# Patient Record
Sex: Male | Born: 1985 | Race: Black or African American | Hispanic: No | Marital: Single | Smoking: Current every day smoker
Health system: Southern US, Community
[De-identification: ages and names within clinical notes are randomized; demographics above are authoritative.]

## PROBLEM LIST (undated history)

## (undated) DIAGNOSIS — E119 Type 2 diabetes mellitus without complications: Secondary | ICD-10-CM

---

## 2008-11-23 ENCOUNTER — Emergency Department: Payer: Self-pay | Admitting: Emergency Medicine

## 2008-12-14 ENCOUNTER — Emergency Department: Payer: Self-pay | Admitting: Emergency Medicine

## 2011-03-28 IMAGING — CR DG CHEST 2V
1 series · 2 of 2 positions shown · non-contrast
Comparison: none

REASON FOR EXAM: cough, fever, eval for infiltrate
COMMENTS:

PROCEDURE:     DXR - DXR CHEST PA (OR AP) AND LATERAL  - November 23, 2008 [DATE]
RESULT:     The lung fields are clear. The heart, mediastinal and osseous
structures show no significant abnormalities.

[Series 1: view not recorded · 0.17mm/px · 2 of 2 slices shown]
[im 1/2]
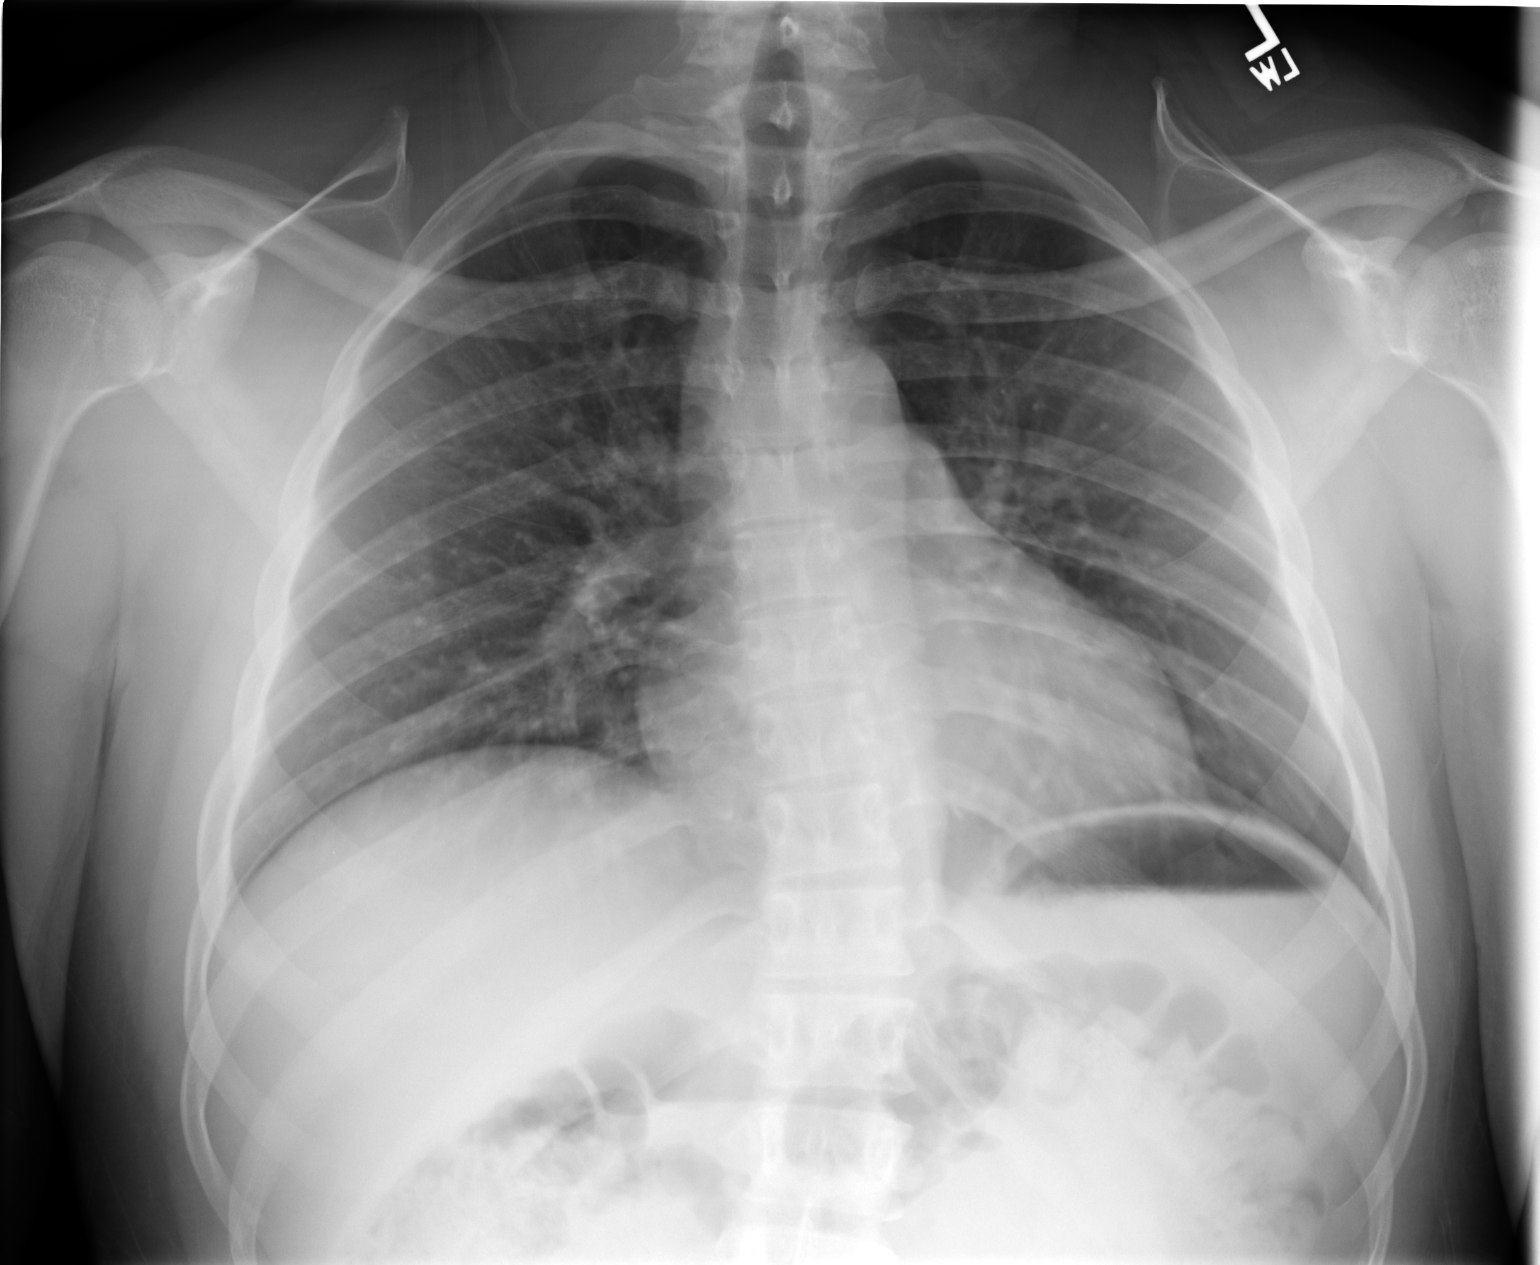
[im 2/2]
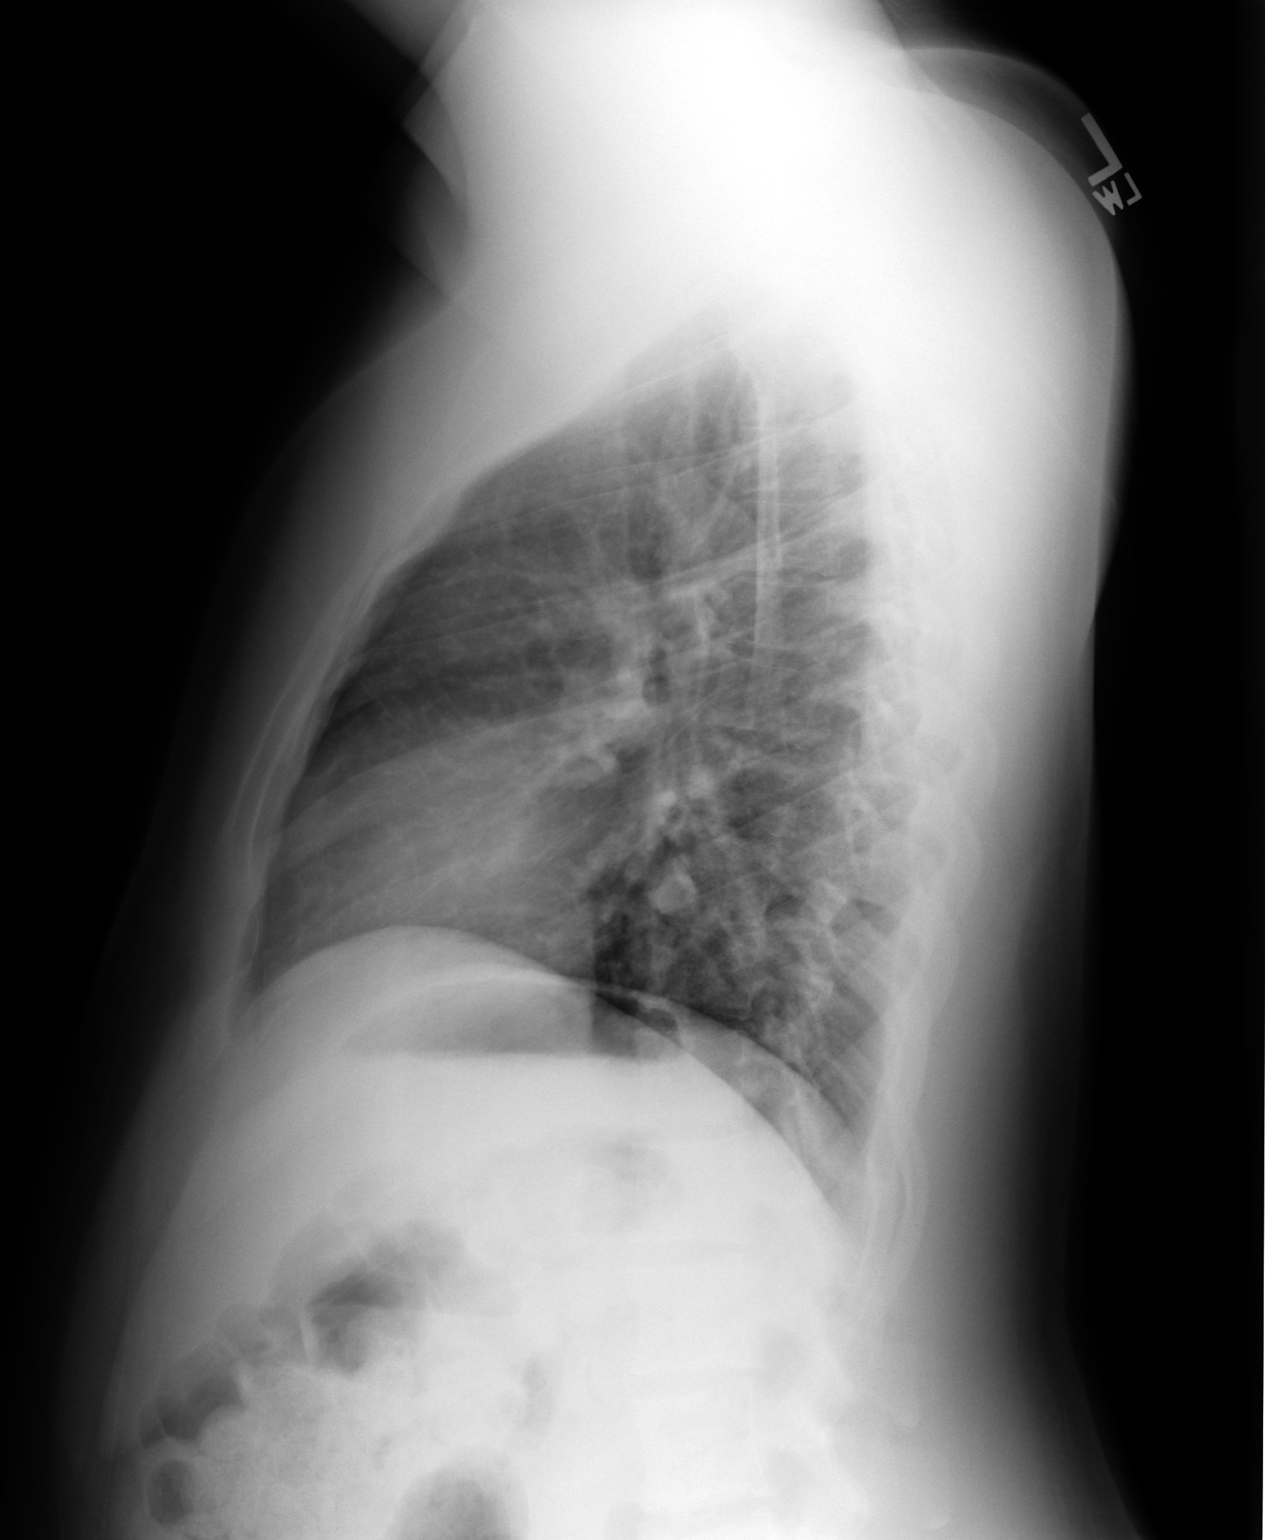

[2 of 2 positions shown; findings below may reference images not displayed]

IMPRESSION: 1.     No significant abnormalities are noted.

## 2015-12-17 ENCOUNTER — Emergency Department: Payer: Self-pay

## 2015-12-17 ENCOUNTER — Encounter: Payer: Self-pay | Admitting: Emergency Medicine

## 2015-12-17 ENCOUNTER — Emergency Department
Admission: EM | Admit: 2015-12-17 | Discharge: 2015-12-17 | Disposition: A | Discharge status: Home / Self Care | Payer: Self-pay | Attending: Emergency Medicine | Admitting: Emergency Medicine

## 2015-12-17 DIAGNOSIS — Y929 Unspecified place or not applicable: Secondary | ICD-10-CM | POA: Insufficient documentation

## 2015-12-17 DIAGNOSIS — H1031 Unspecified acute conjunctivitis, right eye: Secondary | ICD-10-CM | POA: Insufficient documentation

## 2015-12-17 DIAGNOSIS — S41132A Puncture wound without foreign body of left upper arm, initial encounter: Secondary | ICD-10-CM | POA: Insufficient documentation

## 2015-12-17 DIAGNOSIS — F17210 Nicotine dependence, cigarettes, uncomplicated: Secondary | ICD-10-CM | POA: Insufficient documentation

## 2015-12-17 DIAGNOSIS — Y999 Unspecified external cause status: Secondary | ICD-10-CM | POA: Insufficient documentation

## 2015-12-17 DIAGNOSIS — Y9389 Activity, other specified: Secondary | ICD-10-CM | POA: Insufficient documentation

## 2015-12-17 DIAGNOSIS — Z23 Encounter for immunization: Secondary | ICD-10-CM | POA: Insufficient documentation

## 2015-12-17 MED ORDER — TETANUS-DIPHTH-ACELL PERTUSSIS 5-2.5-18.5 LF-MCG/0.5 IM SUSP
0.5000 mL | Freq: Once | INTRAMUSCULAR | Status: AC
  Administered 2015-12-17: 0.5 mL via INTRAMUSCULAR
  Filled 2015-12-17: qty 0.5

## 2015-12-17 MED ORDER — CEPHALEXIN 500 MG PO CAPS: 1000.0000 mg | ORAL_CAPSULE | Freq: Two times a day (BID) | ORAL | 0 refills | Status: AC

## 2015-12-17 MED ORDER — ERYTHROMYCIN 5 MG/GM OP OINT: TOPICAL_OINTMENT | Freq: Three times a day (TID) | OPHTHALMIC | 0 refills | Status: AC
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
## 2015-12-17 NOTE — ED Notes (Signed)
Pt also reports redness to the right eye that started a couple of days ago. Pt states it has been itching but denies any purulent drainage or crusting of the lid. Conjunctiva is red on the right, clear on the left.
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
## 2016-01-24 ENCOUNTER — Emergency Department
Admission: EM | Admit: 2016-01-24 | Discharge: 2016-01-24 | Disposition: A | Discharge status: Home / Self Care | Payer: Self-pay | Attending: Emergency Medicine | Admitting: Emergency Medicine

## 2016-01-24 ENCOUNTER — Encounter: Payer: Self-pay | Admitting: Emergency Medicine

## 2016-01-24 DIAGNOSIS — M62830 Muscle spasm of back: Secondary | ICD-10-CM | POA: Insufficient documentation

## 2016-01-24 DIAGNOSIS — M62838 Other muscle spasm: Secondary | ICD-10-CM

## 2016-01-24 DIAGNOSIS — F17210 Nicotine dependence, cigarettes, uncomplicated: Secondary | ICD-10-CM | POA: Insufficient documentation

## 2016-01-24 MED ORDER — NAPROXEN 125 MG/5ML PO SUSP: 500.0000 mg | Freq: Two times a day (BID) | ORAL | 0 refills | Status: AC

## 2016-01-24 NOTE — ED Triage Notes (Signed)
He developed left sided lower back pain fopr the past couple of days  States pain radiates up into mid back   Ambulates well denies any urinary sx
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
## 2016-05-22 ENCOUNTER — Encounter: Payer: Self-pay | Admitting: Emergency Medicine

## 2016-05-22 ENCOUNTER — Emergency Department
Admission: EM | Admit: 2016-05-22 | Discharge: 2016-05-22 | Disposition: A | Discharge status: Home / Self Care | Payer: Self-pay | Attending: Emergency Medicine | Admitting: Emergency Medicine

## 2016-05-22 ENCOUNTER — Ambulatory Visit
Admission: EM | Admit: 2016-05-22 | Discharge: 2016-05-22 | Disposition: A | Discharge status: Other Acute Inpatient Hospital | Payer: Self-pay | Attending: Family Medicine | Admitting: Family Medicine

## 2016-05-22 DIAGNOSIS — R739 Hyperglycemia, unspecified: Secondary | ICD-10-CM

## 2016-05-22 DIAGNOSIS — F17210 Nicotine dependence, cigarettes, uncomplicated: Secondary | ICD-10-CM | POA: Insufficient documentation

## 2016-05-22 DIAGNOSIS — E119 Type 2 diabetes mellitus without complications: Secondary | ICD-10-CM | POA: Insufficient documentation

## 2016-05-22 DIAGNOSIS — R350 Frequency of micturition: Secondary | ICD-10-CM

## 2016-05-22 LAB — CBC
HEMATOCRIT: 45.5 % (ref 40.0–52.0)
Hemoglobin: 16.1 g/dL (ref 13.0–18.0)
MCH: 29.3 pg (ref 26.0–34.0)
MCHC: 35.5 g/dL (ref 32.0–36.0)
MCV: 82.6 fL (ref 80.0–100.0)
PLATELETS: 210 10*3/uL (ref 150–440)
RBC: 5.51 MIL/uL (ref 4.40–5.90)
RDW: 13 % (ref 11.5–14.5)
WBC: 7.4 10*3/uL (ref 3.8–10.6)

## 2016-05-22 LAB — URINALYSIS, COMPLETE (UACMP) WITH MICROSCOPIC
BILIRUBIN URINE: NEGATIVE
Bacteria, UA: NONE SEEN
Bacteria, UA: NONE SEEN
Bilirubin Urine: NEGATIVE
GLUCOSE, UA: 500 mg/dL — AB
Glucose, UA: >=500 mg/dL — AB
Hgb urine dipstick: NEGATIVE
Ketones, ur: 15 mg/dL — AB
Ketones, ur: 20 mg/dL — AB
Leukocytes, UA: NEGATIVE
Leukocytes, UA: NEGATIVE
Nitrite: NEGATIVE
Nitrite: NEGATIVE
PROTEIN: NEGATIVE mg/dL
Protein, ur: NEGATIVE mg/dL
RBC / HPF: NONE SEEN RBC/hpf (ref 0–5)
Specific Gravity, Urine: <1.005 — ABNORMAL LOW (ref 1.005–1.030)
Specific Gravity, Urine: 1.035 — ABNORMAL HIGH (ref 1.005–1.030)
pH: 5.5 (ref 5.0–8.0)
pH: 6 (ref 5.0–8.0)

## 2016-05-22 LAB — BASIC METABOLIC PANEL
Anion gap: 13 (ref 5–15)
BUN: 12 mg/dL (ref 6–20)
CO2: 22 mmol/L (ref 22–32)
Calcium: 9.6 mg/dL (ref 8.9–10.3)
Chloride: 94 mmol/L — ABNORMAL LOW (ref 101–111)
Creatinine, Ser: 1.02 mg/dL (ref 0.61–1.24)
GFR calc Af Amer: >60 mL/min (ref >60)
GFR calc non Af Amer: >60 mL/min (ref >60)
Glucose, Bld: 552 mg/dL (ref 65–99)
Potassium: 3.7 mmol/L (ref 3.5–5.1)
Sodium: 129 mmol/L — ABNORMAL LOW (ref 135–145)

## 2016-05-22 LAB — GLUCOSE, CAPILLARY
Glucose-Capillary: 550 mg/dL (ref 65–99)
Glucose-Capillary: 497 mg/dL — ABNORMAL HIGH (ref 65–99)
Glucose-Capillary: 377 mg/dL — ABNORMAL HIGH (ref 65–99)

## 2016-05-22 MED ORDER — LIVING WELL WITH DIABETES BOOK
Freq: Once | Status: AC
  Administered 2016-05-22: 16:00:00
  Filled 2016-05-22: qty 1

## 2016-05-22 MED ORDER — METFORMIN HCL 500 MG PO TABS
500.0000 mg | ORAL_TABLET | Freq: Once | ORAL | Status: AC
  Administered 2016-05-22: 500 mg via ORAL
  Filled 2016-05-22: qty 1

## 2016-05-22 MED ORDER — METFORMIN HCL 500 MG PO TABS: 500.0000 mg | ORAL_TABLET | Freq: Two times a day (BID) | ORAL | 11 refills | Status: DC

## 2016-05-22 MED ORDER — SODIUM CHLORIDE 0.9 % IV BOLUS (SEPSIS)
1000.0000 mL | Freq: Once | INTRAVENOUS | Status: AC
  Administered 2016-05-22: 1000 mL via INTRAVENOUS

## 2016-05-22 MED ORDER — SODIUM CHLORIDE 0.9 % IV SOLN
Freq: Once | INTRAVENOUS | Status: AC
  Administered 2016-05-22: 17:00:00 via INTRAVENOUS

## 2016-05-22 MED ORDER — INSULIN ASPART 100 UNIT/ML ~~LOC~~ SOLN
2.0000 [IU] | Freq: Once | SUBCUTANEOUS | Status: AC
  Administered 2016-05-22: 2 [IU] via INTRAVENOUS

## 2016-05-22 MED ORDER — INSULIN ASPART 100 UNIT/ML ~~LOC~~ SOLN
SUBCUTANEOUS | Status: AC
  Administered 2016-05-22: 2 [IU] via INTRAVENOUS
  Filled 2016-05-22: qty 2

## 2016-05-22 NOTE — ED Triage Notes (Signed)
Patient c/o increase in urinary urgency and frequency.  Patient reports being more thirsty.

## 2016-05-22 NOTE — ED Notes (Signed)
CBG 377. Lenise ArenaKate,RN notified and [facility].

## 2016-05-22 NOTE — ED Notes (Signed)
Pt states no hx of DM, states grandparent and cousin but not parents. Pt states CBG elevated yest. Pt states he has been drinking a lot more and urinating a lot more. Pt appears anxious.

## 2016-05-22 NOTE — Discharge Instructions (Signed)
Start the metformin 1 pill twice a day. Check your fingersticks at least in the morning before breakfast and before bed. Keep a list of all the blood glucose readings. Get a follow-up appointment with a doctor. You can try [facility]. They have a charity care program which should help with the cost. You can also try [facility]. You can try the [facility], or the [facility], the [facility] or the [facility]. These are all either reduced cost or free but they're harder to get into some times. [facility] also sometimes conceive for reduced cost. Please return here if your sugar goes above 350. After you have taken the metformin 500 mg twice a day for about a week and if your blood glucose is 250 or higher you can increase the metformin  to thousand milligrams in the morning and 500 mg at night. If after 2 weeks her blood sugars are still over 250 and you have not been able to get into a doctor you can increase the metformin to 1000 mg twice a day. Return to the emergency room immediately if you began to feel sick or ill at all.

## 2016-05-22 NOTE — ED Provider Notes (Signed)
[facility]    CSN: [id number] Arrival date & time: 05/22/16  1220     History   Chief Complaint Chief Complaint  Patient presents with  . Urinary Frequency    HPI Barry Jennings is a 32 y.o. male.   Patient is a 31 year old [place] male who's been having frequency for over 2 weeks now. He states is progressively getting worse the point where he is unable sleep at night he's going getting up going to the bathroom multiple times during the day feels weak and tired denies any weight loss. Denies any burning with urination just going to the bathroom more often. No history of diabetes in the family except for his grandmother. He states that he has previous surgery so previous medical problems no known drug allergies but he does smoke.   The history is provided by the patient and a significant other. No language interpreter was used.  Urinary Frequency  This is a new problem. The current episode started more than 1 week ago. The problem occurs constantly. The problem has been gradually worsening. Pertinent negatives include no chest pain, no abdominal pain, no headaches and no shortness of breath. Nothing aggravates the symptoms. Nothing relieves the symptoms. He has tried nothing for the symptoms. The treatment provided no relief.    History reviewed. No pertinent past medical history.  There are no active problems to display for this patient.   History reviewed. No pertinent surgical history.     Home Medications    Prior to Admission medications   Not on File    Family History History reviewed. No pertinent family history.  Social History Social History  Substance Use Topics  . Smoking status: Current Every Day Smoker    Packs/day: 0.50    Types: Cigarettes  . Smokeless tobacco: Never Used  . Alcohol use Yes     Allergies   Patient has no known allergies.   Review of Systems Review of Systems  Respiratory: Negative for shortness of  breath.   Cardiovascular: Negative for chest pain.  Gastrointestinal: Negative for abdominal pain.  Genitourinary: Positive for frequency.  Neurological: Positive for weakness. Negative for headaches.  Psychiatric/Behavioral: Positive for sleep disturbance.  All other systems reviewed and are negative.    Physical Exam Triage Vital Signs ED Triage Vitals  Enc Vitals Group     BP 05/22/16 1256 119/73     Pulse Rate 05/22/16 1256 (!) 102     Resp 05/22/16 1256 16     Temp 05/22/16 1256 98.2 F (36.8 C)     Temp Source 05/22/16 1256 Oral     SpO2 05/22/16 1256 96 %     Weight 05/22/16 1255 205 lb (93 kg)     Height 05/22/16 1255 5\' 6"  (1.676 m)     Head Circumference --      Peak Flow --      Pain Score 05/22/16 1255 0     Pain Loc --      Pain Edu? --      Excl. in GC? --    No data found.   Updated Vital Signs BP 119/73 (BP Location: Left Arm)   Pulse (!) 102   Temp 98.2 F (36.8 C) (Oral)   Resp 16   Ht 5\' 6"  (1.676 m)   Wt 205 lb (93 kg)   SpO2 96%   BMI 33.09 kg/m   Visual Acuity Right Eye Distance:   Left Eye Distance:  Bilateral Distance:    Right Eye Near:   Left Eye Near:    Bilateral Near:     Physical Exam  Constitutional: He is oriented to person, place, and time. He appears well-developed and well-nourished.  HENT:  Head: Normocephalic and atraumatic.  Right Ear: External ear normal.  Left Ear: External ear normal.  Mouth/Throat: Oropharynx is clear and moist.  Eyes: Pupils are equal, round, and reactive to light.  Neck: Normal range of motion. Neck supple.  Cardiovascular: Normal rate and regular rhythm.   Pulmonary/Chest: Effort normal.  Musculoskeletal: Normal range of motion. He exhibits no edema.  Neurological: He is alert and oriented to person, place, and time.  Skin: Skin is warm.  Psychiatric: He has a normal mood and affect.  Vitals reviewed.    UC Treatments / Results  Labs (all labs ordered are listed, but only abnormal  results are displayed) Labs Reviewed  URINALYSIS, COMPLETE (UACMP) WITH MICROSCOPIC - Abnormal; Notable for the following:       Result Value   Color, Urine STRAW (*)    Specific Gravity, Urine <1.005 (*)    Glucose, UA 500 (*)    Hgb urine dipstick TRACE (*)    Ketones, ur 15 (*)    Squamous Epithelial / LPF 0-5 (*)    All other components within normal limits  GLUCOSE, CAPILLARY - Abnormal; Notable for the following:    Glucose-Capillary 550 (*)    All other components within normal limits  URINE CULTURE  CBG MONITORING, ED    EKG  EKG Interpretation None       Radiology No results found.  Procedures Procedures (including critical care time)  Medications Ordered in UC Medications - No data to display  Results for orders placed or performed during the hospital encounter of 05/22/16  Urinalysis, Complete w Microscopic  Result Value Ref Range   Color, Urine STRAW (A) YELLOW   APPearance CLEAR CLEAR   Specific Gravity, Urine <1.005 (L) 1.005 - 1.030   pH 5.5 5.0 - 8.0   Glucose, UA 500 (A) NEGATIVE mg/dL   Hgb urine dipstick TRACE (A) NEGATIVE   Bilirubin Urine NEGATIVE NEGATIVE   Ketones, ur 15 (A) NEGATIVE mg/dL   Protein, ur NEGATIVE NEGATIVE mg/dL   Nitrite NEGATIVE NEGATIVE   Leukocytes, UA NEGATIVE NEGATIVE   Squamous Epithelial / LPF 0-5 (A) NONE SEEN   WBC, UA 6-30 0 - 5 WBC/hpf   RBC / HPF NONE SEEN 0 - 5 RBC/hpf   Bacteria, UA NONE SEEN NONE SEEN  Glucose, capillary  Result Value Ref Range   Glucose-Capillary 550 (HH) 65 - 99 mg/dL   Initial Impression / Assessment and Plan / UC Course  I have reviewed the triage vital signs and the nursing notes.  Pertinent labs & imaging results that were available during my care of the patient were reviewed by me and considered in my medical decision making (see chart for details).   patient's blood sugar was over 500 teaspoon sugar in his urine and spilling ketones. [facility]  notified of his pending arrival. He does not have a PCP at this time.    Final Clinical Impressions(s) / UC Diagnoses   Final diagnoses:  Urinary frequency  Elevated blood sugar level    New Prescriptions There are no discharge medications for this patient.   Note: This dictation was prepared with Dragon dictation along with smaller phrase technology. Any transcriptional errors that result from  this process are unintentional.   Hassan Rowan, MD 05/22/16 [phone]

## 2016-05-22 NOTE — ED Provider Notes (Signed)
[facility] Emergency Department Provider Note   ____________________________________________   First MD Initiated Contact with Patient 05/22/16 [phone]     (approximate)  I have reviewed the triage vital signs and the nursing notes.   HISTORY  Chief Complaint Hyperglycemia   HPI Barry Jennings is a 31 y.o. male [facility] from [facility] care for high blood sugar. He's been having polydipsia and polyuria for about 2 weeks. Getting worse. Feels tired and is not losing any weight. Has not having any dysuria. 2 people in his family do have diabetes. He had a sore throat for almost a month but this is now gone. He is not having any other complaints no fever no coughing no abdominal pain.  { History reviewed. No pertinent past medical history.  There are no active problems to display for this patient.   History reviewed. No pertinent surgical history.  Prior to Admission medications   Medication Sig Start Date End Date Taking? Authorizing Provider  metFORMIN (GLUCOPHAGE) 500 MG tablet Take 1 tablet (500 mg total) by mouth 2 (two) times daily with a meal. 05/22/16 05/22/17  Arnaldo NatalMalinda, Paul F, MD    Allergies Patient has no known allergies.  No family history on file.  Social History Social History  Substance Use Topics  . Smoking status: Current Every Day Smoker    Packs/day: 0.50    Types: Cigarettes  . Smokeless tobacco: Never Used  . Alcohol use Yes    Review of Systems  Constitutional: No fever/chills Eyes: No visual changes. ENT: No sore throat. Cardiovascular: Denies chest pain. Respiratory: Denies shortness of breath. Gastrointestinal: No abdominal pain.  No nausea, no vomiting.  No diarrhea.  No constipation. Genitourinary: Negative for dysuria. Musculoskeletal: Negative for back pain. Skin: Negative for rash. Neurological: Negative for headaches, focal weakness or numbness.   ____________________________________________   PHYSICAL  EXAM:  VITAL SIGNS: ED Triage Vitals  Enc Vitals Group     BP 05/22/16 1553 134/74     Pulse Rate 05/22/16 1553 98     Resp 05/22/16 1553 18     Temp 05/22/16 1553 99.1 F (37.3 C)     Temp Source 05/22/16 1553 Oral     SpO2 05/22/16 1553 94 %     Weight 05/22/16 1553 213 lb 12.8 oz (97 kg)     Height 05/22/16 1553 5\' 6"  (1.676 m)     Head Circumference --      Peak Flow --      Pain Score 05/22/16 1635 0     Pain Loc --      Pain Edu? --      Excl. in GC? --     Constitutional: Alert and oriented. Well appearing and in no acute distress. Eyes: Conjunctivae are normal. PERRL. EOMI. Head: Atraumatic. Nose: No congestion/rhinnorhea. Mouth/Throat: Mucous membranes are moist.  Oropharynx non-erythematous. Neck: No stridor.  Cardiovascular: Normal rate, regular rhythm. Grossly normal heart sounds.  Good peripheral circulation. Respiratory: Normal respiratory effort.  No retractions. Lungs CTAB. Gastrointestinal: Soft and nontender. No distention. No abdominal bruits. No CVA tenderness. Musculoskeletal: No lower extremity tenderness nor edema.  No joint effusions. Neurologic:  Normal speech and language. No gross focal neurologic deficits are appreciated. No gait instability. Skin:  Skin is warm, dry and intact. No rash noted. Psychiatric: Mood and affect are normal. Speech and behavior are normal.  ____________________________________________   LABS (all labs ordered are listed, but only abnormal results are displayed)  Labs Reviewed  BASIC METABOLIC PANEL - Abnormal; Notable for the following:       Result Value   Sodium 129 (*)    Chloride 94 (*)    Glucose, Bld 552 (*)    All other components within normal limits  URINALYSIS, COMPLETE (UACMP) WITH MICROSCOPIC - Abnormal; Notable for the following:    Color, Urine STRAW (*)    APPearance CLEAR (*)    Specific Gravity, Urine 1.035 (*)    Glucose, UA >=500 (*)    Ketones, ur 20 (*)    Squamous Epithelial / LPF 0-5 (*)     All other components within normal limits  GLUCOSE, CAPILLARY - Abnormal; Notable for the following:    Glucose-Capillary 497 (*)    All other components within normal limits  GLUCOSE, CAPILLARY - Abnormal; Notable for the following:    Glucose-Capillary 377 (*)    All other components within normal limits  CBC  CBG MONITORING, ED  CBG MONITORING, ED   ____________________________________________  EKG   ____________________________________________  RADIOLOGY   ____________________________________________   PROCEDURES  Procedure(s) performed:   Procedures  Critical Care performed:  ____________________________________________   INITIAL IMPRESSION / ASSESSMENT AND PLAN / ED COURSE  Pertinent labs & imaging results that were available during my care of the patient were reviewed by me and considered in my medical decision making (see chart for details).        ____________________________________________   FINAL CLINICAL IMPRESSION(S) / ED DIAGNOSES  Final diagnoses:  New onset type 2 diabetes mellitus (HCC)      NEW MEDICATIONS STARTED DURING THIS VISIT:  New Prescriptions   METFORMIN (GLUCOPHAGE) 500 MG TABLET    Take 1 tablet (500 mg total) by mouth 2 (two) times daily with a meal.     Note:  This document was prepared using Dragon voice recognition software and may include unintentional dictation errors.    Arnaldo Natal, MD 05/22/16 [phone]

## 2016-05-22 NOTE — ED Notes (Signed)
Pt ambulatory to toilet with no assistance, denies dizziness or weakness.

## 2016-05-22 NOTE — ED Triage Notes (Signed)
Pt with increased urination and has been very thirsty. Pt was sent [facility] from [facility] for further eval of high blood sugar.

## 2016-05-22 NOTE — ED Notes (Signed)
[facility] given [facility]

## 2016-05-24 LAB — URINE CULTURE
Culture: NO GROWTH
Special Requests: NORMAL

## 2016-06-01 ENCOUNTER — Encounter: Payer: Self-pay | Admitting: Emergency Medicine

## 2016-06-01 ENCOUNTER — Emergency Department
Admission: EM | Admit: 2016-06-01 | Discharge: 2016-06-01 | Disposition: A | Discharge status: Home / Self Care | Payer: Self-pay | Attending: Emergency Medicine | Admitting: Emergency Medicine

## 2016-06-01 DIAGNOSIS — F17210 Nicotine dependence, cigarettes, uncomplicated: Secondary | ICD-10-CM | POA: Insufficient documentation

## 2016-06-01 DIAGNOSIS — Z7984 Long term (current) use of oral hypoglycemic drugs: Secondary | ICD-10-CM | POA: Insufficient documentation

## 2016-06-01 DIAGNOSIS — R739 Hyperglycemia, unspecified: Secondary | ICD-10-CM

## 2016-06-01 DIAGNOSIS — E119 Type 2 diabetes mellitus without complications: Secondary | ICD-10-CM

## 2016-06-01 DIAGNOSIS — E1165 Type 2 diabetes mellitus with hyperglycemia: Secondary | ICD-10-CM | POA: Insufficient documentation

## 2016-06-01 HISTORY — DX: Type 2 diabetes mellitus without complications: E11.9

## 2016-06-01 LAB — BASIC METABOLIC PANEL
Anion gap: 9 (ref 5–15)
BUN: 12 mg/dL (ref 6–20)
CHLORIDE: 101 mmol/L (ref 101–111)
CO2: 26 mmol/L (ref 22–32)
Calcium: 9.2 mg/dL (ref 8.9–10.3)
Creatinine, Ser: 0.88 mg/dL (ref 0.61–1.24)
Glucose, Bld: 307 mg/dL — ABNORMAL HIGH (ref 65–99)
POTASSIUM: 3.7 mmol/L (ref 3.5–5.1)
SODIUM: 136 mmol/L (ref 135–145)

## 2016-06-01 LAB — CBC
HEMATOCRIT: 42.5 % (ref 40.0–52.0)
Hemoglobin: 15 g/dL (ref 13.0–18.0)
MCH: 29.3 pg (ref 26.0–34.0)
MCHC: 35.3 g/dL (ref 32.0–36.0)
MCV: 83 fL (ref 80.0–100.0)
PLATELETS: 194 10*3/uL (ref 150–440)
RBC: 5.13 MIL/uL (ref 4.40–5.90)
RDW: 13.2 % (ref 11.5–14.5)
WBC: 4.8 10*3/uL (ref 3.8–10.6)

## 2016-06-01 LAB — URINALYSIS, COMPLETE (UACMP) WITH MICROSCOPIC
BACTERIA UA: NONE SEEN
BILIRUBIN URINE: NEGATIVE
Glucose, UA: >=500 mg/dL — AB
KETONES UR: 20 mg/dL — AB
LEUKOCYTES UA: NEGATIVE
NITRITE: NEGATIVE
PH: 5 (ref 5.0–8.0)
Protein, ur: 30 mg/dL — AB
SPECIFIC GRAVITY, URINE: 1.04 — AB (ref 1.005–1.030)

## 2016-06-01 LAB — GLUCOSE, CAPILLARY: Glucose-Capillary: 272 mg/dL — ABNORMAL HIGH (ref 65–99)

## 2016-06-01 MED ORDER — METFORMIN HCL 500 MG PO TABS: 1000.0000 mg | ORAL_TABLET | Freq: Two times a day (BID) | ORAL | 0 refills | Status: DC

## 2016-06-01 NOTE — ED Notes (Signed)
Fluctuating blood sugars since be dx with diabetes X 2 weeks ago. Pt alert and oriented X4, active, cooperative, pt in NAD. RR even and unlabored, color WNL.

## 2016-06-01 NOTE — ED Triage Notes (Signed)
Pt reports blood sugars have been up and down at home and he was told to come back if they were doing this. Reports does not have PCP.  They do not stay high but will range 200-500.

## 2016-06-01 NOTE — ED Provider Notes (Signed)
[facility] Emergency Department Provider Note  ____________________________________________   First MD Initiated Contact with Patient 06/01/16 1159     (approximate)  I have reviewed the triage vital signs and the nursing notes.   HISTORY  Chief Complaint Hyperglycemia    HPI Barry Jennings is a 31 y.o. male who self presents to the emergency department with hyperglycemia to 400 checked last night. Newly 2 weeks ago he came to our emergency department for polyuria and polydipsia and was diagnosed with diabetes mellitus type 2 and was begun on metformin 500 mg twice a day. He says ever since then his symptoms have dramatically improved and he no longer wakes up at night having to urinate. He has been working ever since and has been unable to establish care with a primary care physician. His wife has diabetes and he has been checking his blood sugar with her machine and became concerned that the sugar level was high so he came back today for recheck.    Past Medical History:  Diagnosis Date  . Diabetes mellitus without complication (HCC)     There are no active problems to display for this patient.   History reviewed. No pertinent surgical history.  Prior to Admission medications   Medication Sig Start Date End Date Taking? Authorizing Provider  metFORMIN (GLUCOPHAGE) 500 MG tablet Take 2 tablets (1,000 mg total) by mouth 2 (two) times daily with a meal. 06/01/16 06/01/17  Merrily Brittleifenbark, Montrez [place], MD    Allergies Patient has no known allergies.  History reviewed. No pertinent family history.  Social History Social History  Substance Use Topics  . Smoking status: Current Every Day Smoker    Packs/day: 0.50    Types: Cigarettes  . Smokeless tobacco: Never Used  . Alcohol use Yes    Review of Systems Constitutional: No fever/chills ENT: No sore throat. Cardiovascular: Denies chest pain. Respiratory: Denies shortness of breath. Gastrointestinal: No  abdominal pain.  No nausea, no vomiting.  No diarrhea.  No constipation. Musculoskeletal: Negative for back pain. Neurological: Negative for headaches   ____________________________________________   PHYSICAL EXAM:  VITAL SIGNS: ED Triage Vitals  Enc Vitals Group     BP 06/01/16 1011 126/70     Pulse Rate 06/01/16 1011 79     Resp 06/01/16 1011 16     Temp 06/01/16 1011 98.4 F (36.9 C)     Temp Source 06/01/16 1011 Oral     SpO2 06/01/16 1011 100 %     Weight 06/01/16 1010 213 lb (96.6 kg)     Height 06/01/16 1010 5\' 6"  (1.676 m)     Head Circumference --      Peak Flow --      Pain Score --      Pain Loc --      Pain Edu? --      Excl. in GC? --     Constitutional: Alert and oriented x 4 well appearing nontoxic no diaphoresis speaks in full, clear sentences Head: Atraumatic. Nose: No congestion/rhinnorhea. Mouth/Throat: No trismus Neck: No stridor.   Cardiovascular: Regular rate and rhythm no murmurs Respiratory: Normal respiratory effort.  No retractions. Gastrointestinal: Soft nontender Neurologic:  Normal speech and language. No gross focal neurologic deficits are appreciated.  Skin:  Skin is warm, dry and intact. No rash noted.    ____________________________________________  LABS (all labs ordered are listed, but only abnormal results are displayed)  Labs Reviewed  BASIC METABOLIC PANEL - Abnormal; Notable for the  following:       Result Value   Glucose, Bld 307 (*)    All other components within normal limits  URINALYSIS, COMPLETE (UACMP) WITH MICROSCOPIC - Abnormal; Notable for the following:    Color, Urine YELLOW (*)    APPearance HAZY (*)    Specific Gravity, Urine 1.040 (*)    Glucose, UA >=500 (*)    Hgb urine dipstick SMALL (*)    Ketones, ur 20 (*)    Protein, ur 30 (*)    Squamous Epithelial / LPF 0-5 (*)    All other components within normal limits  GLUCOSE, CAPILLARY - Abnormal; Notable for the following:    Glucose-Capillary 272 (*)     All other components within normal limits  CBC  HEMOGLOBIN A1C  CBG MONITORING, ED    Hyperglycemia with normal anion gap he does have proteinuria __________________________________________  EKG   ____________________________________________  RADIOLOGY  ____________________________________________   PROCEDURES  Procedure(s) performed: no  Procedures  Critical Care performed: no  Observation: no ____________________________________________   INITIAL IMPRESSION / ASSESSMENT AND PLAN / ED COURSE  Pertinent labs & imaging results that were available during my care of the patient were reviewed by me and considered in my medical decision making (see chart for details).  The patient is well-appearing and hemodynamically stable. He has no signs of diabetic ketoacidosis or hyperosmolar state. His symptoms improved and he is now able to sleep through the night without urinating. I will increase his metformin from 500 mg twice a day 2000 mg twice a day and help him establish care with a primary care physician.      ____________________________________________   FINAL CLINICAL IMPRESSION(S) / ED DIAGNOSES  Final diagnoses:  Hyperglycemia  Diabetes mellitus type 2 in nonobese (HCC)      NEW MEDICATIONS STARTED DURING THIS VISIT:  Current Discharge Medication List       Note:  This document was prepared using Dragon voice recognition software and may include unintentional dictation errors.      Merrily Brittle, MD 06/01/16 [phone]

## 2016-06-01 NOTE — ED Notes (Signed)
Pt alert and oriented X4, active, cooperative, pt in NAD. RR even and unlabored, color WNL.  Pt informed to return if any life threatening symptoms occur.   

## 2016-06-01 NOTE — Discharge Instructions (Signed)
Please increase her metformin to 1000 mg twice a day and establish care with a primary care physician next week for recheck. Return to the emergency department for any concerns.  It was a pleasure to take care of you today, and thank you for coming to our emergency department.  If you have any questions or concerns before leaving please ask the nurse to grab me and I'm more than happy to go through your aftercare instructions again.  If you were prescribed any opioid pain medication today such as Norco, Vicodin, Percocet, morphine, hydrocodone, or oxycodone please make sure you do not drive when you are taking this medication as it can alter your ability to drive safely.  If you have any concerns once you are home that you are not improving or are in fact getting worse before you can make it to your follow-up appointment, please do not hesitate to call 911 and come back for further evaluation.  Merrily BrittleNeil Jahrel Borthwick MD  Results for orders placed or performed during the hospital encounter of 06/01/16  Basic metabolic panel  Result Value Ref Range   Sodium 136 135 - 145 mmol/L   Potassium 3.7 3.5 - 5.1 mmol/L   Chloride 101 101 - 111 mmol/L   CO2 26 22 - 32 mmol/L   Glucose, Bld 307 (H) 65 - 99 mg/dL   BUN 12 6 - 20 mg/dL   Creatinine, Ser 4.090.88 0.61 - 1.24 mg/dL   Calcium 9.2 8.9 - 81.110.3 mg/dL   GFR calc non Af Amer >60 >60 mL/min   GFR calc Af Amer >60 >60 mL/min   Anion gap 9 5 - 15  CBC  Result Value Ref Range   WBC 4.8 3.8 - 10.6 K/uL   RBC 5.13 4.40 - 5.90 MIL/uL   Hemoglobin 15.0 13.0 - 18.0 g/dL   HCT 91.442.5 78.240.0 - 95.652.0 %   MCV 83.0 80.0 - 100.0 fL   MCH 29.3 26.0 - 34.0 pg   MCHC 35.3 32.0 - 36.0 g/dL   RDW 21.313.2 08.611.5 - 57.814.5 %   Platelets 194 150 - 440 K/uL  Urinalysis, Complete w Microscopic  Result Value Ref Range   Color, Urine YELLOW (A) YELLOW   APPearance HAZY (A) CLEAR   Specific Gravity, Urine 1.040 (H) 1.005 - 1.030   pH 5.0 5.0 - 8.0   Glucose, UA >=500 (A) NEGATIVE  mg/dL   Hgb urine dipstick SMALL (A) NEGATIVE   Bilirubin Urine NEGATIVE NEGATIVE   Ketones, ur 20 (A) NEGATIVE mg/dL   Protein, ur 30 (A) NEGATIVE mg/dL   Nitrite NEGATIVE NEGATIVE   Leukocytes, UA NEGATIVE NEGATIVE   RBC / HPF 0-5 0 - 5 RBC/hpf   WBC, UA 6-30 0 - 5 WBC/hpf   Bacteria, UA NONE SEEN NONE SEEN   Squamous Epithelial / LPF 0-5 (A) NONE SEEN   Mucous PRESENT   Glucose, capillary  Result Value Ref Range   Glucose-Capillary 272 (H) 65 - 99 mg/dL

## 2016-06-02 LAB — HEMOGLOBIN A1C
Hgb A1c MFr Bld: 12.2 % — ABNORMAL HIGH (ref 4.8–5.6)
Mean Plasma Glucose: 303 mg/dL

## 2016-06-03 ENCOUNTER — Encounter: Payer: Self-pay | Admitting: Emergency Medicine

## 2016-06-03 ENCOUNTER — Emergency Department
Admission: EM | Admit: 2016-06-03 | Discharge: 2016-06-03 | Disposition: A | Discharge status: Home / Self Care | Payer: Self-pay | Attending: Student in an Organized Health Care Education/Training Program | Admitting: Student in an Organized Health Care Education/Training Program

## 2016-06-03 DIAGNOSIS — F17210 Nicotine dependence, cigarettes, uncomplicated: Secondary | ICD-10-CM | POA: Insufficient documentation

## 2016-06-03 DIAGNOSIS — K644 Residual hemorrhoidal skin tags: Secondary | ICD-10-CM | POA: Insufficient documentation

## 2016-06-03 DIAGNOSIS — Z79899 Other long term (current) drug therapy: Secondary | ICD-10-CM | POA: Insufficient documentation

## 2016-06-03 DIAGNOSIS — Z7984 Long term (current) use of oral hypoglycemic drugs: Secondary | ICD-10-CM | POA: Insufficient documentation

## 2016-06-03 DIAGNOSIS — K649 Unspecified hemorrhoids: Secondary | ICD-10-CM

## 2016-06-03 DIAGNOSIS — E119 Type 2 diabetes mellitus without complications: Secondary | ICD-10-CM | POA: Insufficient documentation

## 2016-06-03 MED ORDER — HYDROCORTISONE ACETATE 25 MG RE SUPP
25.0000 mg | Freq: Two times a day (BID) | RECTAL | 0 refills | Status: DC
Start: 2016-06-03 — End: 2019-03-16

## 2016-06-03 MED ORDER — DIBUCAINE 1 % RE OINT: 1.0000 "application " | TOPICAL_OINTMENT | RECTAL | 0 refills | Status: DC | PRN

## 2016-06-03 MED ORDER — POLYETHYLENE GLYCOL 3350 17 G PO PACK: 17.0000 g | PACK | Freq: Every day | ORAL | 0 refills | Status: DC

## 2016-06-03 NOTE — ED Provider Notes (Signed)
[facility] Emergency Department Provider Note    None    (approximate)  I have reviewed the triage vital signs and the nursing notes.   HISTORY  Chief Complaint Hemorrhoids    HPI Barry Jennings is a 31 y.o. male presents with chief complaint of rectal pain that started yesterday. Patient states he does have a history of constipation taking long times to pass a bowel movement. States he does intermittently strain. States that since yesterday has been having severe pain even with ambulating. Has noted streaks of blood on wiping. No diarrhea. No fevers. Denies any historyof hemorrhoids. States he doesn't have any severe pain when defecating but does have pain with wiping. Is not tried anything for pain.   Past Medical History:  Diagnosis Date  . Diabetes mellitus without complication (HCC)    No family history on file. History reviewed. No pertinent surgical history. There are no active problems to display for this patient.     Prior to Admission medications   Medication Sig Start Date End Date Taking? Authorizing Provider  dibucaine (NUPERCAINAL) 1 % OINT Place 1 application rectally as needed for pain. 06/03/16   Willy Eddy, MD  hydrocortisone (ANUSOL-HC) 25 MG suppository Place 1 suppository (25 mg total) rectally 2 (two) times daily. 06/03/16   Willy Eddy, MD  metFORMIN (GLUCOPHAGE) 500 MG tablet Take 2 tablets (1,000 mg total) by mouth 2 (two) times daily with a meal. 06/01/16 06/01/17  Merrily Brittle, MD  polyethylene glycol (MIRALAX / GLYCOLAX) packet Take 17 g by mouth daily. Mix one tablespoon with 8oz of your favorite juice or water every day until you are having soft formed stools. Then start taking once daily if you didn't have a stool the day before. 06/03/16   Willy Eddy, MD    Allergies Patient has no known allergies.    Social History Social History  Substance Use Topics  . Smoking status: Current Every Day Smoker   Packs/day: 0.50    Types: Cigarettes  . Smokeless tobacco: Never Used  . Alcohol use Yes    Review of Systems Patient denies headaches, rhinorrhea, blurry vision, numbness, shortness of breath, chest pain, edema, cough, abdominal pain, nausea, vomiting, diarrhea, dysuria, fevers, rashes or hallucinations unless otherwise stated above in HPI. ____________________________________________   PHYSICAL EXAM:  VITAL SIGNS: Vitals:   06/03/16 2025  BP: 121/67  Pulse: 96  Resp: 18  Temp: 98.3 F (36.8 C)    Constitutional: Alert and oriented. Well appearing and in no acute distress. Eyes: Conjunctivae are normal.  Head: Atraumatic. Nose: No congestion/rhinnorhea. Mouth/Throat: Mucous membranes are moist.   Neck: Painless ROM.  Cardiovascular:   Good peripheral circulation. Respiratory: Normal respiratory effort.  No retractions.  Gastrointestinal: Soft and nontender.  Musculoskeletal: No lower extremity tenderness .  No joint effusions. GU:  Inflamed left external hemorrhoid.  No significant thrombosis noted at this time.  No purulent drainage, no masses, no cellulitis, blisters or perineal ecchymosis or cellulitic changes Neurologic:  Normal speech and language. No gross focal neurologic deficits are appreciated.  Skin:  Skin is warm, dry and intact. No rash noted. Psychiatric: Mood and affect are normal. Speech and behavior are normal.  ____________________________________________   LABS (all labs ordered are listed, but only abnormal results are displayed)  No results found for this or any previous visit (from the past 24 hour(s)). ____________________________________________ _____________________________   PROCEDURES  Procedure(s) performed:  Procedures    Critical Care performed: no ____________________________________________  INITIAL IMPRESSION / ASSESSMENT AND PLAN / ED COURSE  Pertinent labs & imaging results that were available during my care of the  patient were reviewed by me and considered in my medical decision making (see chart for details).  DDX: hemorroids, thrombosis of hemorrhoid, anal fissure, not consistent with proctitis or fourniers  Barry Jennings is a 31 y.o. who presents to the ED with evidence of inflamed external hemorrhoid. No significant thrombosis noted at this time. Does have some firmness but seems to be more inflammatory at this time. There is no evidence of infectious process. At this point I do think that trial of outpatient management with a rectal suppository and laxatives would be most appropriate at this point.  Have discussed with the patient and available family all diagnostics and treatments performed thus far and all questions were answered to the best of my ability. The patient demonstrates understanding and agreement with plan.       ____________________________________________   FINAL CLINICAL IMPRESSION(S) / ED DIAGNOSES  Final diagnoses:  Acute hemorrhoid  Inflamed external hemorrhoid      NEW MEDICATIONS STARTED DURING THIS VISIT:  New Prescriptions   DIBUCAINE (NUPERCAINAL) 1 % OINT    Place 1 application rectally as needed for pain.   HYDROCORTISONE (ANUSOL-HC) 25 MG SUPPOSITORY    Place 1 suppository (25 mg total) rectally 2 (two) times daily.   POLYETHYLENE GLYCOL (MIRALAX / GLYCOLAX) PACKET    Take 17 g by mouth daily. Mix one tablespoon with 8oz of your favorite juice or water every day until you are having soft formed stools. Then start taking once daily if you didn't have a stool the day before.     Note:  This document was prepared using Dragon voice recognition software and may include unintentional dictation errors.     Willy Eddyobinson, Carry Ortez, MD 06/03/16 2106

## 2016-06-03 NOTE — ED Triage Notes (Signed)
Patient reports that he feels like he has a hemorrhoid. Patient states that he started having pain and swelling in his rectal area yesterday.

## 2016-06-03 NOTE — ED Notes (Signed)
Patient with complaints of a possible hemorrhoid. States it is difficult for him to walk due to the pain. 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 Has noted small streak of blood on his underwear but denies large amounts of blood. Patient admits to issues with constipation and does strain to use the bathroom.

## 2016-06-04 ENCOUNTER — Telehealth: Payer: Self-pay | Admitting: Emergency Medicine

## 2016-06-04 NOTE — Telephone Encounter (Signed)
Called patient to inform of a1c and ask about follow up plans.  He says he is going to call today to get a doctor at [facility].   I explained the a1c.

## 2016-08-06 ENCOUNTER — Emergency Department
Admission: EM | Admit: 2016-08-06 | Discharge: 2016-08-07 | Disposition: A | Discharge status: Home / Self Care | Payer: Self-pay | Attending: Emergency Medicine | Admitting: Emergency Medicine

## 2016-08-06 ENCOUNTER — Encounter: Payer: Self-pay | Admitting: Emergency Medicine

## 2016-08-06 DIAGNOSIS — E1165 Type 2 diabetes mellitus with hyperglycemia: Secondary | ICD-10-CM | POA: Insufficient documentation

## 2016-08-06 DIAGNOSIS — R739 Hyperglycemia, unspecified: Secondary | ICD-10-CM

## 2016-08-06 DIAGNOSIS — F17210 Nicotine dependence, cigarettes, uncomplicated: Secondary | ICD-10-CM | POA: Insufficient documentation

## 2016-08-06 LAB — URINALYSIS, COMPLETE (UACMP) WITH MICROSCOPIC
BACTERIA UA: NONE SEEN
Bilirubin Urine: NEGATIVE
Glucose, UA: >=500 mg/dL — AB
Hgb urine dipstick: NEGATIVE
KETONES UR: 20 mg/dL — AB
Leukocytes, UA: NEGATIVE
Nitrite: NEGATIVE
PROTEIN: NEGATIVE mg/dL
Specific Gravity, Urine: 1.035 — ABNORMAL HIGH (ref 1.005–1.030)
pH: 5 (ref 5.0–8.0)

## 2016-08-06 LAB — HEPATIC FUNCTION PANEL
ALBUMIN: 4 g/dL (ref 3.5–5.0)
ALT: 17 U/L (ref 17–63)
AST: 22 U/L (ref 15–41)
Alkaline Phosphatase: 103 U/L (ref 38–126)
BILIRUBIN INDIRECT: 1.7 mg/dL — AB (ref 0.3–0.9)
BILIRUBIN TOTAL: 2 mg/dL — AB (ref 0.3–1.2)
Bilirubin, Direct: 0.3 mg/dL (ref 0.1–0.5)
TOTAL PROTEIN: 7.2 g/dL (ref 6.5–8.1)

## 2016-08-06 LAB — BASIC METABOLIC PANEL
Anion gap: 14 (ref 5–15)
BUN: 14 mg/dL (ref 6–20)
CO2: 21 mmol/L — ABNORMAL LOW (ref 22–32)
CREATININE: 1.15 mg/dL (ref 0.61–1.24)
Calcium: 9.4 mg/dL (ref 8.9–10.3)
Chloride: 97 mmol/L — ABNORMAL LOW (ref 101–111)
GFR calc non Af Amer: >60 mL/min (ref >60)
Glucose, Bld: 537 mg/dL (ref 65–99)
Potassium: 3.8 mmol/L (ref 3.5–5.1)
Sodium: 132 mmol/L — ABNORMAL LOW (ref 135–145)

## 2016-08-06 LAB — CBC
HCT: 43.4 % (ref 40.0–52.0)
HEMOGLOBIN: 14.9 g/dL (ref 13.0–18.0)
MCH: 29 pg (ref 26.0–34.0)
MCHC: 34.2 g/dL (ref 32.0–36.0)
MCV: 84.6 fL (ref 80.0–100.0)
PLATELETS: 187 10*3/uL (ref 150–440)
RBC: 5.13 MIL/uL (ref 4.40–5.90)
RDW: 13.2 % (ref 11.5–14.5)
WBC: 6.5 10*3/uL (ref 3.8–10.6)

## 2016-08-06 LAB — BLOOD GAS, VENOUS
ACID-BASE DEFICIT: 2.5 mmol/L — AB (ref 0.0–2.0)
Bicarbonate: 21.7 mmol/L (ref 20.0–28.0)
FIO2: 0.21
O2 SAT: 89.2 %
PATIENT TEMPERATURE: 37
PCO2 VEN: 35 mmHg — AB (ref 44.0–60.0)
pH, Ven: 7.4 (ref 7.250–7.430)
pO2, Ven: 57 mmHg — ABNORMAL HIGH (ref 32.0–45.0)

## 2016-08-06 LAB — GLUCOSE, CAPILLARY
GLUCOSE-CAPILLARY: 498 mg/dL — AB (ref 65–99)
GLUCOSE-CAPILLARY: 407 mg/dL — AB (ref 65–99)
GLUCOSE-CAPILLARY: 403 mg/dL — AB (ref 65–99)

## 2016-08-06 MED ORDER — SODIUM CHLORIDE 0.9 % IV BOLUS (SEPSIS)
1000.0000 mL | Freq: Once | INTRAVENOUS | Status: AC
  Administered 2016-08-06: 1000 mL via INTRAVENOUS

## 2016-08-06 MED ORDER — INSULIN ASPART 100 UNIT/ML ~~LOC~~ SOLN
10.0000 [IU] | Freq: Once | SUBCUTANEOUS | Status: AC
  Administered 2016-08-06: 10 [IU] via SUBCUTANEOUS
  Filled 2016-08-06: qty 1

## 2016-08-06 MED ORDER — SODIUM CHLORIDE 0.9 % IV BOLUS (SEPSIS): 1000.0000 mL | Freq: Once | INTRAVENOUS | Status: DC

## 2016-08-06 NOTE — ED Provider Notes (Signed)
[facility] Emergency Department Provider Note  ____________________________________________   9:06 PM I have reviewed the triage vital signs and the nursing notes.   HISTORY  Chief Complaint Hyperglycemia   HPI Barry Jennings is a 31 y.o. male who presents to the emergency department for evaluation of high blood sugar readings over the past 2 days. He stopped taking his metformin about a month ago because it was causing him to feel nauseated and "just not himself." Over the past 2 days, he has had significant increase in polydipsia and polyuria.   Past Medical History:  Diagnosis Date  . Diabetes mellitus without complication (HCC)     There are no active problems to display for this patient.   History reviewed. No pertinent surgical history.  Prior to Admission medications   Medication Sig Start Date End Date Taking? Authorizing Provider  dibucaine (NUPERCAINAL) 1 % OINT Place 1 application rectally as needed for pain. Patient not taking: Reported on 08/06/2016 06/03/16   Willy Eddyobinson, Patrick, MD  hydrocortisone (ANUSOL-HC) 25 MG suppository Place 1 suppository (25 mg total) rectally 2 (two) times daily. Patient not taking: Reported on 08/06/2016 06/03/16   Willy Eddyobinson, Patrick, MD  metFORMIN (GLUCOPHAGE) 500 MG tablet Take 2 tablets (1,000 mg total) by mouth 2 (two) times daily with a meal. Patient not taking: Reported on 08/06/2016 06/01/16 06/01/17  Merrily Brittleifenbark, Neil, MD  polyethylene glycol (MIRALAX / Ethelene HalGLYCOLAX) packet Take 17 g by mouth daily. Mix one tablespoon with 8oz of your favorite juice or water every day until you are having soft formed stools. Then start taking once daily if you didn't have a stool the day before. Patient not taking: Reported on 08/06/2016 06/03/16   Willy Eddyobinson, Patrick, MD    Allergies Patient has no known allergies.  History reviewed. No pertinent family history.  Social History Social History  Substance Use Topics  . Smoking status:  Current Every Day Smoker    Packs/day: 0.50    Types: Cigarettes  . Smokeless tobacco: Never Used  . Alcohol use Yes    Review of Systems  Constitutional: No fever/chills Eyes: No visual changes. ENT: No sore throat. Cardiovascular: Denies chest pain. Respiratory: Denies shortness of breath. Gastrointestinal: No abdominal pain.  No nausea, no vomiting.  No diarrhea.  No constipation. Genitourinary: Negative for dysuria. Positive for polyuria. Musculoskeletal: Negative for back pain. Skin: Negative for rash. Neurological: Negative for headaches, focal weakness or numbness. ____________________________________________   PHYSICAL EXAM:  VITAL SIGNS: ED Triage Vitals  Enc Vitals Group     BP -- 110/69     Pulse -- 74     Resp -- 15     Temp -- 97.8     Temp src --      SpO2 -- 98     Weight 08/06/16 2035 213 lb (96.6 kg)     Height --      Head Circumference --      Peak Flow --      Pain Score 08/06/16 2103 0     Pain Loc --      Pain Edu? --      Excl. in GC? --     Constitutional: Alert and oriented. Well appearing and in no acute distress. Eyes: Conjunctivae are normal. PERRL. EOMI. Head: Atraumatic. Nose: No congestion/rhinnorhea. Mouth/Throat: Mucous membranes are moist.  Oropharynx non-erythematous. Neck: No stridor.   Cardiovascular: Normal rate, regular rhythm. Grossly normal heart sounds.  Good peripheral circulation. Respiratory: Normal respiratory effort.  No retractions. Lungs CTAB. Gastrointestinal: Soft and nontender. No distention. No abdominal bruits. No CVA tenderness. Musculoskeletal: No lower extremity tenderness nor edema.  No joint effusions. Neurologic:  Normal speech and language. No gross focal neurologic deficits are appreciated. No gait instability. Skin:  Skin is warm, dry and intact. No rash noted. Psychiatric: Mood and affect are normal. Speech and behavior are normal.  ____________________________________________   LABS (all labs  ordered are listed, but only abnormal results are displayed)  Labs Reviewed  BASIC METABOLIC PANEL - Abnormal; Notable for the following:       Result Value   Sodium 132 (*)    Chloride 97 (*)    CO2 21 (*)    Glucose, Bld 537 (*)    All other components within normal limits  URINALYSIS, COMPLETE (UACMP) WITH MICROSCOPIC - Abnormal; Notable for the following:    Color, Urine STRAW (*)    APPearance CLEAR (*)    Specific Gravity, Urine 1.035 (*)    Glucose, UA >=500 (*)    Ketones, ur 20 (*)    Squamous Epithelial / LPF 0-5 (*)    All other components within normal limits  GLUCOSE, CAPILLARY - Abnormal; Notable for the following:    Glucose-Capillary 498 (*)    All other components within normal limits  HEPATIC FUNCTION PANEL - Abnormal; Notable for the following:    Total Bilirubin 2.0 (*)    Indirect Bilirubin 1.7 (*)    All other components within normal limits  BLOOD GAS, VENOUS - Abnormal; Notable for the following:    pCO2, Ven 35 (*)    pO2, Ven 57.0 (*)    Acid-base deficit 2.5 (*)    All other components within normal limits  GLUCOSE, CAPILLARY - Abnormal; Notable for the following:    Glucose-Capillary 407 (*)    All other components within normal limits  GLUCOSE, CAPILLARY - Abnormal; Notable for the following:    Glucose-Capillary 403 (*)    All other components within normal limits  GLUCOSE, CAPILLARY - Abnormal; Notable for the following:    Glucose-Capillary 372 (*)    All other components within normal limits  CBC  CBG MONITORING, ED  CBG MONITORING, ED   ____________________________________________  EKG  Not indicated. ____________________________________________  RADIOLOGY  No results found.  ____________________________________________   PROCEDURES  Procedure(s) performed: None  Procedures  Critical Care performed: No  ____________________________________________   INITIAL IMPRESSION / ASSESSMENT AND PLAN / ED COURSE  Pertinent  labs & imaging results that were available during my care of the patient were reviewed by me and considered in my medical decision making (see chart for details).  31 year old male presented to the emergency department for evaluation and treatment of hyperglycemia. While in the emergency department, DKA was ruled out and he was given 2 L of normal saline with some reduction of his blood glucose levels.  He did receive 10 units subcutaneous of regular insulin which reduced the glucose to 372. He will resume the metformin, but will only take 500 mg in the morning and 500 mg in the evening. Diabetic diet and diabetic teaching was completed with the patient and significant other. He was given long-term effects of noncompliance with treating his diabetes. He was strongly advised to contact [facility] to schedule an appointment. He was advised to continue monitoring his blood glucose levels and encouraged to keep a log listing the time of day and glucose reading. He was encouraged to return to the  ER for symptoms that change or worsen if unable to schedule an appointment with primary care.      ____________________________________________   FINAL CLINICAL IMPRESSION(S) / ED DIAGNOSES  Final diagnoses:  Hyperglycemia      NEW MEDICATIONS STARTED DURING THIS VISIT:  Discharge Medication List as of 08/07/2016 12:22 AM       Note:  This document was prepared using Dragon voice recognition software and may include unintentional dictation errors.    Chinita Pester, FNP 08/07/16 1610    Phineas Semen, MD 08/08/16 [phone]

## 2016-08-06 NOTE — ED Triage Notes (Signed)
Pt ambulatory to triage with steady gait, no distress noted. Pt reports he stopped taking his metformin 1 month ago after medication was making pt "sick on his stomach," today pt checked CBG at home and reading was over 500. Pt denies symptoms of hyperglycemia. Per pt, his CBG readings since being diagnosed with DM 3 months ago have been over 200.

## 2016-08-06 NOTE — ED Notes (Signed)
Glucose 407. Nurse Swaz[place] alerted.

## 2016-08-06 NOTE — ED Notes (Signed)
ED Provider at bedside. 

## 2016-08-06 NOTE — ED Notes (Signed)
Pt c/o polyuria and polydipsia.hx diabetes, Pt states stopped taking metformin [id number] ago because it makes him "sick". Pt A&OX4

## 2016-08-07 LAB — GLUCOSE, CAPILLARY: Glucose-Capillary: 372 mg/dL — ABNORMAL HIGH (ref 65–99)

## 2016-08-07 NOTE — ED Notes (Signed)
CBG: 327 

## 2017-03-28 ENCOUNTER — Emergency Department: Payer: Self-pay

## 2017-03-28 ENCOUNTER — Emergency Department
Admission: EM | Admit: 2017-03-28 | Discharge: 2017-03-28 | Disposition: A | Discharge status: Home / Self Care | Payer: Self-pay | Attending: Emergency Medicine | Admitting: Emergency Medicine

## 2017-03-28 ENCOUNTER — Other Ambulatory Visit: Payer: Self-pay

## 2017-03-28 DIAGNOSIS — R739 Hyperglycemia, unspecified: Secondary | ICD-10-CM | POA: Insufficient documentation

## 2017-03-28 DIAGNOSIS — E119 Type 2 diabetes mellitus without complications: Secondary | ICD-10-CM | POA: Insufficient documentation

## 2017-03-28 DIAGNOSIS — B370 Candidal stomatitis: Secondary | ICD-10-CM | POA: Insufficient documentation

## 2017-03-28 DIAGNOSIS — F17210 Nicotine dependence, cigarettes, uncomplicated: Secondary | ICD-10-CM | POA: Insufficient documentation

## 2017-03-28 DIAGNOSIS — R079 Chest pain, unspecified: Secondary | ICD-10-CM | POA: Insufficient documentation

## 2017-03-28 LAB — CBC
HCT: 44 % (ref 40.0–52.0)
HEMOGLOBIN: 15.1 g/dL (ref 13.0–18.0)
MCH: 29 pg (ref 26.0–34.0)
MCHC: 34.5 g/dL (ref 32.0–36.0)
MCV: 84.2 fL (ref 80.0–100.0)
PLATELETS: 197 10*3/uL (ref 150–440)
RBC: 5.22 MIL/uL (ref 4.40–5.90)
RDW: 13 % (ref 11.5–14.5)
WBC: 6.1 10*3/uL (ref 3.8–10.6)

## 2017-03-28 LAB — BASIC METABOLIC PANEL
ANION GAP: 12 (ref 5–15)
BUN: 9 mg/dL (ref 6–20)
CALCIUM: 9.4 mg/dL (ref 8.9–10.3)
CO2: 23 mmol/L (ref 22–32)
Chloride: 98 mmol/L — ABNORMAL LOW (ref 101–111)
Creatinine, Ser: 0.77 mg/dL (ref 0.61–1.24)
GFR calc non Af Amer: >60 mL/min (ref >60)
Glucose, Bld: 512 mg/dL (ref 65–99)
Potassium: 3.4 mmol/L — ABNORMAL LOW (ref 3.5–5.1)
SODIUM: 133 mmol/L — AB (ref 135–145)

## 2017-03-28 LAB — GLUCOSE, CAPILLARY: Glucose-Capillary: 423 mg/dL — ABNORMAL HIGH (ref 65–99)

## 2017-03-28 LAB — TROPONIN I

## 2017-03-28 MED ORDER — RANITIDINE HCL 150 MG PO TABS: 150.0000 mg | ORAL_TABLET | Freq: Two times a day (BID) | ORAL | 0 refills | Status: DC

## 2017-03-28 MED ORDER — NYSTATIN 100000 UNIT/ML MT SUSP: 5.0000 mL | Freq: Four times a day (QID) | OROMUCOSAL | 0 refills | Status: AC

## 2017-03-28 MED ORDER — GLIPIZIDE 5 MG PO TABS: 2.5000 mg | ORAL_TABLET | Freq: Two times a day (BID) | ORAL | 0 refills | Status: DC

## 2017-03-28 MED ORDER — SODIUM CHLORIDE 0.9 % IV BOLUS
1000.0000 mL | Freq: Once | INTRAVENOUS | Status: AC
  Administered 2017-03-28: 1000 mL via INTRAVENOUS

## 2017-03-28 NOTE — ED Triage Notes (Signed)
Pt states that he is having left sided chest pain that does not radiate but he states has a hx of acid reflux (worse over the past week)

## 2017-03-28 NOTE — ED Provider Notes (Signed)
[facility] Emergency Department Provider Note  ____________________________________________   First MD Initiated Contact with Patient 03/28/17 1629     (approximate)  I have reviewed the triage vital signs and the nursing notes.   HISTORY  Chief Complaint Chest Pain   HPI Barry Jennings is a 32 y.o. male with a history of diabetes not on any medications who is presenting to the emergency department with sharp left-sided chest pain that started last night and is now gone away.  Patient thinks that he has reflux because he had pain after eating a hamburger several days ago.  However, he denies pain with lying down.  States that he has the reflux because he also has pain to his throat that hurts when he swallows.  He says that he used to be on metformin but is no longer on it because it was making him nauseous.  Is not on anything for diabetes and says that he does not have insurance.  Says that the chest pain was to the left side of the chest and lasted for about an hour and a half last night.  Was worse with movement such as leaning forward.  Denies any shortness of breath, nausea or vomiting.  Past Medical History:  Diagnosis Date  . Diabetes mellitus without complication (HCC)     There are no active problems to display for this patient.   History reviewed. No pertinent surgical history.  Prior to Admission medications   Medication Sig Start Date End Date Taking? Authorizing Provider  dibucaine (NUPERCAINAL) 1 % OINT Place 1 application rectally as needed for pain. Patient not taking: Reported on 08/06/2016 06/03/16   Willy Eddyobinson, Patrick, MD  hydrocortisone (ANUSOL-HC) 25 MG suppository Place 1 suppository (25 mg total) rectally 2 (two) times daily. Patient not taking: Reported on 08/06/2016 06/03/16   Willy Eddyobinson, Patrick, MD  metFORMIN (GLUCOPHAGE) 500 MG tablet Take 2 tablets (1,000 mg total) by mouth 2 (two) times daily with a meal. Patient not taking: Reported  on 08/06/2016 06/01/16 06/01/17  Merrily Brittleifenbark, Neil, MD  polyethylene glycol (MIRALAX / Ethelene HalGLYCOLAX) packet Take 17 g by mouth daily. Mix one tablespoon with 8oz of your favorite juice or water every day until you are having soft formed stools. Then start taking once daily if you didn't have a stool the day before. Patient not taking: Reported on 08/06/2016 06/03/16   Willy Eddyobinson, Patrick, MD    Allergies Patient has no known allergies.  No family history on file.  Social History Social History   Tobacco Use  . Smoking status: Current Every Day Smoker    Packs/day: 1.00    Types: Cigarettes  . Smokeless tobacco: Never Used  Substance Use Topics  . Alcohol use: Yes  . Drug use: No    Review of Systems  Constitutional: No fever/chills Eyes: No visual changes. ENT: No sore throat. Cardiovascular: As above Respiratory: Denies shortness of breath. Gastrointestinal: No abdominal pain.  No nausea, no vomiting.  No diarrhea.  No constipation. Genitourinary: Negative for dysuria. Musculoskeletal: Negative for back pain. Skin: Negative for rash. Neurological: Negative for headaches, focal weakness or numbness.   ____________________________________________   PHYSICAL EXAM:  VITAL SIGNS: ED Triage Vitals [03/28/17 1313]  Enc Vitals Group     BP 114/66     Pulse Rate 93     Resp 16     Temp 99.4 F (37.4 C)     Temp Source Oral     SpO2 98 %  Weight 182 lb (82.6 kg)     Height 5\' 6"  (1.676 m)     Head Circumference      Peak Flow      Pain Score 6     Pain Loc      Pain Edu?      Excl. in GC?     Constitutional: Alert and oriented. Well appearing and in no acute distress. Eyes: Conjunctivae are normal.  Head: Atraumatic. Nose: No congestion/rhinnorhea. Mouth/Throat: Mucous membranes are moist.  Thrush seen on the uvula as well as posterior pharynx.  Mild erythema to the posterior pharynx without any swelling of the structures of the posterior pharynx.  Controlling secretions.   No respiratory distress. Neck: No stridor.   Cardiovascular: Normal rate, regular rhythm. Grossly normal heart sounds.  Chest pain is not reproducible to palpation. Respiratory: Normal respiratory effort.  No retractions. Lungs CTAB. Gastrointestinal: Soft and nontender. No distention.  Musculoskeletal: No lower extremity tenderness nor edema.  No joint effusions. Neurologic:  Normal speech and language. No gross focal neurologic deficits are appreciated. Skin:  Skin is warm, dry and intact. No rash noted. Psychiatric: Mood and affect are normal. Speech and behavior are normal.  ____________________________________________   LABS (all labs ordered are listed, but only abnormal results are displayed)  Labs Reviewed  BASIC METABOLIC PANEL - Abnormal; Notable for the following components:      Result Value   Sodium 133 (*)    Potassium 3.4 (*)    Chloride 98 (*)    Glucose, Bld 512 (*)    All other components within normal limits  GLUCOSE, CAPILLARY - Abnormal; Notable for the following components:   Glucose-Capillary 423 (*)    All other components within normal limits  CBC  TROPONIN I   ____________________________________________  EKG  ED ECG REPORT I, Arelia Longest, the attending physician, personally viewed and interpreted this ECG.   Date: 03/28/2017  EKG Time: 1318  Rate: 90  Rhythm: normal sinus rhythm  Axis: Normal  Intervals:none  ST&T Change: No ST segment elevation or depression.  No abnormal T wave inversion.  ____________________________________________  RADIOLOGY  No acute finding on the chest x-ray ____________________________________________   PROCEDURES  Procedure(s) performed:   Procedures  Critical Care performed:   ____________________________________________   INITIAL IMPRESSION / ASSESSMENT AND PLAN / ED COURSE  Pertinent labs & imaging results that were available during my care of the patient were reviewed by me and considered  in my medical decision making (see chart for details).  Differential diagnosis includes, but is not limited to, ACS, aortic dissection, pulmonary embolism, cardiac tamponade, pneumothorax, pneumonia, pericarditis, myocarditis, GI-related causes including esophagitis/gastritis, and musculoskeletal chest wall pain, thrush As part of my medical decision making, I reviewed the following data within the electronic MEDICAL RECORD NUMBER Notes from prior ED visits  ----------------------------------------- 5:52 PM on 03/28/2017 -----------------------------------------  Patient at this time continues to be chest pain-free.  I discussed case Dr. Katheren Shams including the patient's hyperglycemia and intolerance to metformin.  Dr. Katheren Shams recommends 2.5 mg of glipizide twice daily.  Patient will also be discharged with a prescription for Zantac as well as nystatin oral suspension 100,000 units/mL, 5 mL p.o. 4 times daily for 14 days.  Musculoskeletal chest pain is likely because of worsening with movement of the thorax.  Do not suspect cardio related chest pain. PERC negative.    ____________________________________________   FINAL CLINICAL IMPRESSION(S) / ED DIAGNOSES   Thrush.  Chest Pain. Diabetes.  NEW MEDICATIONS STARTED DURING THIS VISIT:  New Prescriptions   No medications on file     Note:  This document was prepared using Dragon voice recognition software and may include unintentional dictation errors.     Myrna Blazer, MD 03/28/17 [phone]

## 2017-03-28 NOTE — ED Notes (Signed)
Date and time results received: 03/28/17 1400 (use smartphrase ".now" to insert current time)  Test: glucose Critical Value: 512  Name of Provider Notified: kinner  Will start 1 L NS bolus in triage

## 2017-03-28 NOTE — ED Notes (Signed)
CBG 423. Stephanie,RN notified.

## 2017-07-25 ENCOUNTER — Emergency Department
Admission: EM | Admit: 2017-07-25 | Discharge: 2017-07-25 | Disposition: A | Discharge status: Home / Self Care | Payer: Self-pay | Attending: Emergency Medicine | Admitting: Emergency Medicine

## 2017-07-25 ENCOUNTER — Other Ambulatory Visit: Payer: Self-pay

## 2017-07-25 DIAGNOSIS — E1165 Type 2 diabetes mellitus with hyperglycemia: Secondary | ICD-10-CM | POA: Insufficient documentation

## 2017-07-25 DIAGNOSIS — F17210 Nicotine dependence, cigarettes, uncomplicated: Secondary | ICD-10-CM | POA: Insufficient documentation

## 2017-07-25 DIAGNOSIS — R739 Hyperglycemia, unspecified: Secondary | ICD-10-CM

## 2017-07-25 DIAGNOSIS — Z7984 Long term (current) use of oral hypoglycemic drugs: Secondary | ICD-10-CM | POA: Insufficient documentation

## 2017-07-25 LAB — BASIC METABOLIC PANEL
ANION GAP: 8 (ref 5–15)
BUN: 10 mg/dL (ref 6–20)
CALCIUM: 8.9 mg/dL (ref 8.9–10.3)
CO2: 23 mmol/L (ref 22–32)
Chloride: 106 mmol/L (ref 98–111)
Creatinine, Ser: 0.55 mg/dL — ABNORMAL LOW (ref 0.61–1.24)
GFR calc Af Amer: >60 mL/min (ref >60)
GFR calc non Af Amer: >60 mL/min (ref >60)
GLUCOSE: 301 mg/dL — AB (ref 70–99)
POTASSIUM: 3.8 mmol/L (ref 3.5–5.1)
Sodium: 137 mmol/L (ref 135–145)

## 2017-07-25 LAB — GLUCOSE, CAPILLARY
GLUCOSE-CAPILLARY: 248 mg/dL — AB (ref 70–99)
Glucose-Capillary: 278 mg/dL — ABNORMAL HIGH (ref 70–99)

## 2017-07-25 LAB — CBC
HEMATOCRIT: 44 % (ref 40.0–52.0)
HEMOGLOBIN: 15.3 g/dL (ref 13.0–18.0)
MCH: 29.3 pg (ref 26.0–34.0)
MCHC: 34.7 g/dL (ref 32.0–36.0)
MCV: 84.3 fL (ref 80.0–100.0)
Platelets: 176 10*3/uL (ref 150–440)
RBC: 5.23 MIL/uL (ref 4.40–5.90)
RDW: 12.9 % (ref 11.5–14.5)
WBC: 4.4 10*3/uL (ref 3.8–10.6)

## 2017-07-25 LAB — URINALYSIS, COMPLETE (UACMP) WITH MICROSCOPIC
BACTERIA UA: NONE SEEN
BILIRUBIN URINE: NEGATIVE
HGB URINE DIPSTICK: NEGATIVE
KETONES UR: 20 mg/dL — AB
Leukocytes, UA: NEGATIVE
Nitrite: NEGATIVE
PROTEIN: NEGATIVE mg/dL
SPECIFIC GRAVITY, URINE: 1.039 — AB (ref 1.005–1.030)
pH: 6 (ref 5.0–8.0)

## 2017-07-25 MED ORDER — SODIUM CHLORIDE 0.9 % IV BOLUS
1000.0000 mL | Freq: Once | INTRAVENOUS | Status: AC
  Administered 2017-07-25: 1000 mL via INTRAVENOUS

## 2017-07-25 MED ORDER — GLIPIZIDE 5 MG PO TABS: 5.0000 mg | ORAL_TABLET | Freq: Two times a day (BID) | ORAL | 11 refills | Status: DC

## 2017-07-25 NOTE — Discharge Instructions (Addendum)
Please increase your glipizide to 1 pill twice a day or 5 mg twice a day.  Please follow-up with either the [facility] which can be free but hard to get into or the [facility] or the [facility] or the [facility] or [facility] or you can try [facility] where the doctors in training are supervised by fully trained doctors or [facility].  [facility] care is open to residents of the [place].  All of these require lots of paperwork.  Please return here if you feel like your blood sugar is going up.  Please try to keep doing your fingerstick twice a day and keep a record of it.  Please carry some hard candy or other form of sugar with you at all times remember if you get sweaty shaky or has your heart racing it could be a sign of low blood sugar and you should take some sugar right away.

## 2017-07-25 NOTE — ED Triage Notes (Signed)
Pt arrives to ED stating his CBG is elevated. Recent dx of DM, unsure of type 1 or type 2. Has been drinking more water and using restroom more but doesn't check CBG at home. Alert, oriented, ambulatory. States started on metformin but changed to glipizide.

## 2017-07-25 NOTE — ED Notes (Signed)
Spoke with pt about wait times and what to expect next. Advised pt that I am available for further questions if needed.  

## 2017-07-25 NOTE — ED Provider Notes (Signed)
[facility] Emergency Department Provider Note   ____________________________________________   First MD Initiated Contact with Patient 07/25/17 1510     (approximate)  I have reviewed the triage vital signs and the nursing notes.   HISTORY  Chief Complaint Hyperglycemia    HPI Barry Jennings is a 32 y.o. male who has diabetes.  His sugars were acting like they are up today he has been drinking more and urinating more.  He was taken off the metformin because it made him nauseated and lightheaded started on glipizide.  He is been on 5 mg of glipizide a day for about a month possibly more.  He does not have a regular doctor.  He has no other complaints he is not having any signs of infection.  {* Past Medical History:  Diagnosis Date  . Diabetes mellitus without complication (HCC)     There are no active problems to display for this patient.   History reviewed. No pertinent surgical history.  Prior to Admission medications   Medication Sig Start Date End Date Taking? Authorizing Provider  dibucaine (NUPERCAINAL) 1 % OINT Place 1 application rectally as needed for pain. Patient not taking: Reported on 08/06/2016 06/03/16   Willy Eddy, MD  glipiZIDE (GLUCOTROL) 5 MG tablet Take 0.5 tablets (2.5 mg total) by mouth 2 (two) times daily before a meal. 03/28/17 03/28/18  Schaevitz, Myra Rude, MD  hydrocortisone (ANUSOL-HC) 25 MG suppository Place 1 suppository (25 mg total) rectally 2 (two) times daily. Patient not taking: Reported on 08/06/2016 06/03/16   Willy Eddy, MD  metFORMIN (GLUCOPHAGE) 500 MG tablet Take 2 tablets (1,000 mg total) by mouth 2 (two) times daily with a meal. Patient not taking: Reported on 08/06/2016 06/01/16 06/01/17  Merrily Brittle, MD  polyethylene glycol (MIRALAX / Ethelene Hal) packet Take 17 g by mouth daily. Mix one tablespoon with 8oz of your favorite juice or water every day until you are having soft formed stools. Then start  taking once daily if you didn't have a stool the day before. Patient not taking: Reported on 08/06/2016 06/03/16   Willy Eddy, MD  ranitidine (ZANTAC) 150 MG tablet Take 1 tablet (150 mg total) by mouth 2 (two) times daily. 03/28/17 03/28/18  Schaevitz, Myra Rude, MD    Allergies Patient has no known allergies.  History reviewed. No pertinent family history.  Social History Social History   Tobacco Use  . Smoking status: Current Every Day Smoker    Packs/day: 1.00    Types: Cigarettes  . Smokeless tobacco: Never Used  Substance Use Topics  . Alcohol use: Yes  . Drug use: No    Review of Systems  Constitutional: No fever/chills Eyes: No visual changes. ENT: No sore throat. Cardiovascular: Denies chest pain. Respiratory: Denies shortness of breath. Gastrointestinal: No abdominal pain.  No nausea, no vomiting.  No diarrhea.  No constipation. Genitourinary: Negative for dysuria. Musculoskeletal: Negative for back pain. Skin: Negative for rash. Neurological: Negative for headaches, focal weakness    ____________________________________________   PHYSICAL EXAM:  VITAL SIGNS: ED Triage Vitals [07/25/17 1214]  Enc Vitals Group     BP (!) 150/70     Pulse Rate 92     Resp 18     Temp 98.2 F (36.8 C)     Temp Source Oral     SpO2 98 %     Weight 180 lb (81.6 kg)     Height 5\' 6"  (1.676 m)     Head Circumference  Peak Flow      Pain Score 0     Pain Loc      Pain Edu?      Excl. in GC?     Constitutional: Alert and oriented. Well appearing and in no acute distress. Eyes: Conjunctivae are normal. PER. EOMI. Head: Atraumatic. Nose: No congestion/rhinnorhea. Mouth/Throat: Mucous membranes are moist.  Oropharynx non-erythematous. Neck: No stridor. Cardiovascular: Normal rate, regular rhythm. Grossly normal heart sounds.  Good peripheral circulation. Respiratory: Normal respiratory effort.  No retractions. Lungs CTAB. Gastrointestinal: Soft and  nontender. No distention. No abdominal bruits. No CVA tenderness. Musculoskeletal: No lower extremity tenderness nor edema. Neurologic:  Normal speech and language. No gross focal neurologic deficits are appreciated.  Skin:  Skin is warm, dry and intact. No rash noted. Psychiatric: Mood and affect are normal. Speech and behavior are normal.  ____________________________________________   LABS (all labs ordered are listed, but only abnormal results are displayed)  Labs Reviewed  BASIC METABOLIC PANEL - Abnormal; Notable for the following components:      Result Value   Glucose, Bld 301 (*)    Creatinine, Ser 0.55 (*)    All other components within normal limits  URINALYSIS, COMPLETE (UACMP) WITH MICROSCOPIC - Abnormal; Notable for the following components:   Color, Urine YELLOW (*)    APPearance CLEAR (*)    Specific Gravity, Urine 1.039 (*)    Glucose, UA >=500 (*)    Ketones, ur 20 (*)    All other components within normal limits  GLUCOSE, CAPILLARY - Abnormal; Notable for the following components:   Glucose-Capillary 278 (*)    All other components within normal limits  GLUCOSE, CAPILLARY - Abnormal; Notable for the following components:   Glucose-Capillary 248 (*)    All other components within normal limits  CBC  CBG MONITORING, ED   ____________________________________________  EKG   ____________________________________________  RADIOLOGY  ED MD interpretation:    Official radiology report(s): No results found.  ____________________________________________   PROCEDURES  Procedure(s) performed:  Procedures  Critical Care performed:   ____________________________________________   INITIAL IMPRESSION / ASSESSMENT AND PLAN / ED COURSE Patient is doing well he is unable unfortunately unable to take metformin I will increase his glipizide from 5 to 10 mg have been take 1 5 mg pill twice a day.  I will give him a list of follow-up places him get a regular  physician encourage yearly eye exams etc.      ____________________________________________   FINAL CLINICAL IMPRESSION(S) / ED DIAGNOSES  Final diagnoses:  Hyperglycemia     ED Discharge Orders    None       Note:  This document was prepared using Dragon voice recognition software and may include unintentional dictation errors.    Arnaldo NatalMalinda, Gracelee Stemmler F, MD 07/25/17 [phone]

## 2017-12-22 ENCOUNTER — Encounter: Payer: Self-pay | Admitting: Emergency Medicine

## 2017-12-22 ENCOUNTER — Emergency Department
Admission: EM | Admit: 2017-12-22 | Discharge: 2017-12-22 | Disposition: A | Discharge status: Home / Self Care | Payer: Self-pay | Attending: Emergency Medicine | Admitting: Emergency Medicine

## 2017-12-22 ENCOUNTER — Other Ambulatory Visit: Payer: Self-pay

## 2017-12-22 DIAGNOSIS — R631 Polydipsia: Secondary | ICD-10-CM | POA: Insufficient documentation

## 2017-12-22 DIAGNOSIS — Z7984 Long term (current) use of oral hypoglycemic drugs: Secondary | ICD-10-CM | POA: Insufficient documentation

## 2017-12-22 DIAGNOSIS — E1165 Type 2 diabetes mellitus with hyperglycemia: Secondary | ICD-10-CM | POA: Insufficient documentation

## 2017-12-22 DIAGNOSIS — Z79899 Other long term (current) drug therapy: Secondary | ICD-10-CM | POA: Insufficient documentation

## 2017-12-22 DIAGNOSIS — R739 Hyperglycemia, unspecified: Secondary | ICD-10-CM

## 2017-12-22 DIAGNOSIS — E119 Type 2 diabetes mellitus without complications: Secondary | ICD-10-CM

## 2017-12-22 DIAGNOSIS — F17210 Nicotine dependence, cigarettes, uncomplicated: Secondary | ICD-10-CM | POA: Insufficient documentation

## 2017-12-22 LAB — COMPREHENSIVE METABOLIC PANEL
ALBUMIN: 4.5 g/dL (ref 3.5–5.0)
ALT: 26 U/L (ref 0–44)
AST: 24 U/L (ref 15–41)
Alkaline Phosphatase: 85 U/L (ref 38–126)
Anion gap: 11 (ref 5–15)
BUN: 9 mg/dL (ref 6–20)
CHLORIDE: 96 mmol/L — AB (ref 98–111)
CO2: 26 mmol/L (ref 22–32)
CREATININE: 0.74 mg/dL (ref 0.61–1.24)
Calcium: 9.2 mg/dL (ref 8.9–10.3)
GFR calc Af Amer: >60 mL/min (ref >60)
GFR calc non Af Amer: >60 mL/min (ref >60)
GLUCOSE: 443 mg/dL — AB (ref 70–99)
POTASSIUM: 3.6 mmol/L (ref 3.5–5.1)
Sodium: 133 mmol/L — ABNORMAL LOW (ref 135–145)
Total Bilirubin: 1.4 mg/dL — ABNORMAL HIGH (ref 0.3–1.2)
Total Protein: 7.6 g/dL (ref 6.5–8.1)

## 2017-12-22 LAB — CBC WITH DIFFERENTIAL/PLATELET
Abs Immature Granulocytes: 0.01 10*3/uL (ref 0.00–0.07)
BASOS PCT: 1 %
Basophils Absolute: 0 10*3/uL (ref 0.0–0.1)
EOS ABS: 0.1 10*3/uL (ref 0.0–0.5)
Eosinophils Relative: 2 %
HCT: 45.7 % (ref 39.0–52.0)
Hemoglobin: 15.6 g/dL (ref 13.0–17.0)
IMMATURE GRANULOCYTES: 0 %
LYMPHS ABS: 1.8 10*3/uL (ref 0.7–4.0)
Lymphocytes Relative: 38 %
MCH: 28.3 pg (ref 26.0–34.0)
MCHC: 34.1 g/dL (ref 30.0–36.0)
MCV: 82.9 fL (ref 80.0–100.0)
MONOS PCT: 7 %
Monocytes Absolute: 0.3 10*3/uL (ref 0.1–1.0)
NEUTROS PCT: 52 %
NRBC: 0 % (ref 0.0–0.2)
Neutro Abs: 2.5 10*3/uL (ref 1.7–7.7)
PLATELETS: 200 10*3/uL (ref 150–400)
RBC: 5.51 MIL/uL (ref 4.22–5.81)
RDW: 12.3 % (ref 11.5–15.5)
WBC: 4.8 10*3/uL (ref 4.0–10.5)

## 2017-12-22 LAB — URINALYSIS, COMPLETE (UACMP) WITH MICROSCOPIC
BACTERIA UA: NONE SEEN
Bilirubin Urine: NEGATIVE
Glucose, UA: >=500 mg/dL — AB
HGB URINE DIPSTICK: NEGATIVE
Ketones, ur: 20 mg/dL — AB
Leukocytes, UA: NEGATIVE
Nitrite: NEGATIVE
PROTEIN: NEGATIVE mg/dL
SPECIFIC GRAVITY, URINE: 1.039 — AB (ref 1.005–1.030)
SQUAMOUS EPITHELIAL / LPF: NONE SEEN (ref 0–5)
pH: 7 (ref 5.0–8.0)

## 2017-12-22 LAB — GLUCOSE, CAPILLARY
GLUCOSE-CAPILLARY: 440 mg/dL — AB (ref 70–99)
GLUCOSE-CAPILLARY: 297 mg/dL — AB (ref 70–99)

## 2017-12-22 MED ORDER — INSULIN ASPART 100 UNIT/ML ~~LOC~~ SOLN
3.0000 [IU] | Freq: Once | SUBCUTANEOUS | Status: AC
  Administered 2017-12-22: 3 [IU] via INTRAVENOUS
  Filled 2017-12-22: qty 1

## 2017-12-22 MED ORDER — GLIPIZIDE 5 MG PO TABS: 5.0000 mg | ORAL_TABLET | Freq: Two times a day (BID) | ORAL | 1 refills | Status: DC

## 2017-12-22 MED ORDER — SODIUM CHLORIDE 0.9 % IV BOLUS
1000.0000 mL | Freq: Once | INTRAVENOUS | Status: AC
  Administered 2017-12-22: 1000 mL via INTRAVENOUS

## 2017-12-22 MED ORDER — GLIPIZIDE 5 MG PO TABS
5.0000 mg | ORAL_TABLET | ORAL | Status: AC
  Administered 2017-12-22: 5 mg via ORAL
  Filled 2017-12-22: qty 1

## 2017-12-22 NOTE — ED Provider Notes (Signed)
-----------------------------------------   5:32 PM on 12/22/2017 -----------------------------------------  I took over care on this patient from Dr. Fanny BienQuale.  His glucose is now below 300 after fluids and insulin.  He is feeling better.  He is stable for discharge home at this time.  We have prescribed glipizide.  Return precautions given, and he expresses understanding.   Dionne BucySiadecki, Evony Rezek, MD 12/22/17 [phone]

## 2017-12-22 NOTE — ED Triage Notes (Signed)
States polyuria and polydipsia x 5 days. States is diabetic and has not checked blood in one month. States has not taken blood sugar medication x 2 weeks.

## 2017-12-22 NOTE — ED Provider Notes (Signed)
[facility] Emergency Department Provider Note   ____________________________________________   First MD Initiated Contact with Patient 12/22/17 1231     (approximate)  I have reviewed the triage vital signs and the nursing notes.   HISTORY  Chief Complaint Polyuria and Polydipsia    HPI Barry Jennings is a 32 y.o. male here for evaluation for increased urination  Patient reports that 2 weeks ago he ran out of his glipizide for which he takes 5 mg twice a day.  He is noticed over the last 3 or 4 days that he is been more thirsty, and last night he was up in for back and forth the bathroom many times.  Ports he is have the same when his blood sugars been high.  No confusion or pain.  No nausea vomiting.  Ports he just thinks needs get back on his medications, and his diabetes is probably causing his blood sugars to be too high.  He does not routinely check his blood sugar as he recently moved, but he needs to get his meter working again.    No recent illnesses.  Is thought about setting up appointment with [facility] in the past, but has not done so yet.  He ran out of medication 2 weeks ago.  Past Medical History:  Diagnosis Date  . Diabetes mellitus without complication (HCC)     There are no active problems to display for this patient.   History reviewed. No pertinent surgical history.  Prior to Admission medications   Medication Sig Start Date End Date Taking? Authorizing Provider  dibucaine (NUPERCAINAL) 1 % OINT Place 1 application rectally as needed for pain. Patient not taking: Reported on 08/06/2016 06/03/16   Willy Eddyobinson, Patrick, MD  glipiZIDE (GLUCOTROL) 5 MG tablet Take 0.5 tablets (2.5 mg total) by mouth 2 (two) times daily before a meal. 03/28/17 03/28/18  Schaevitz, Myra Rudeavid Matthew, MD  glipiZIDE (GLUCOTROL) 5 MG tablet Take 1 tablet (5 mg total) by mouth 2 (two) times daily. 07/25/17 07/25/18  Arnaldo NatalMalinda, Paul F, MD  hydrocortisone  (ANUSOL-HC) 25 MG suppository Place 1 suppository (25 mg total) rectally 2 (two) times daily. Patient not taking: Reported on 08/06/2016 06/03/16   Willy Eddyobinson, Patrick, MD  metFORMIN (GLUCOPHAGE) 500 MG tablet Take 2 tablets (1,000 mg total) by mouth 2 (two) times daily with a meal. Patient not taking: Reported on 08/06/2016 06/01/16 06/01/17  Merrily Brittleifenbark, Neil, MD  polyethylene glycol (MIRALAX / Ethelene HalGLYCOLAX) packet Take 17 g by mouth daily. Mix one tablespoon with 8oz of your favorite juice or water every day until you are having soft formed stools. Then start taking once daily if you didn't have a stool the day before. Patient not taking: Reported on 08/06/2016 06/03/16   Willy Eddyobinson, Patrick, MD  ranitidine (ZANTAC) 150 MG tablet Take 1 tablet (150 mg total) by mouth 2 (two) times daily. 03/28/17 03/28/18  Schaevitz, Myra Rudeavid Matthew, MD   Patient reports to me that the only medicine is actually on his glipizide 5 mg twice daily which he ran out of  Allergies Patient has no known allergies.  No family history on file.  Social History Social History   Tobacco Use  . Smoking status: Current Every Day Smoker    Packs/day: 1.00    Types: Cigarettes  . Smokeless tobacco: Never Used  Substance Use Topics  . Alcohol use: Yes  . Drug use: No    Review of Systems Constitutional: No fever/chills Eyes: No visual changes. ENT: No  sore throat.  Fairly thirsty drinking more water last 2-4 days. Cardiovascular: Denies chest pain. Respiratory: Denies shortness of breath. Gastrointestinal: No abdominal pain.   Genitourinary: Negative for dysuria.  Increased urination. Musculoskeletal: Negative for back pain. Skin: Negative for rash. Neurological: Negative for headaches, areas of focal weakness or numbness.    ____________________________________________   PHYSICAL EXAM:  VITAL SIGNS: ED Triage Vitals [12/22/17 1100]  Enc Vitals Group     BP (!) 144/93     Pulse Rate 86     Resp 18     Temp 98.7 F (37.1  C)     Temp Source Oral     SpO2 100 %     Weight 181 lb (82.1 kg)     Height 5\' 7"  (1.702 m)     Head Circumference      Peak Flow      Pain Score 0     Pain Loc      Pain Edu?      Excl. in GC?     Constitutional: Alert and oriented. Well appearing and in no acute distress. Eyes: Conjunctivae are normal. Head: Atraumatic. Nose: No congestion/rhinnorhea. Mouth/Throat: Mucous membranes are moist. Neck: No stridor.  Cardiovascular: Normal rate, regular rhythm. Grossly normal heart sounds.  Good peripheral circulation. Respiratory: Normal respiratory effort.  No retractions. Lungs CTAB. Gastrointestinal: Soft and nontender. No distention. Musculoskeletal: No lower extremity tenderness nor edema. Neurologic:  Normal speech and language. No gross focal neurologic deficits are appreciated.  Skin:  Skin is warm, dry and intact. No rash noted. Psychiatric: Mood and affect are normal. Speech and behavior are normal.  ____________________________________________   LABS (all labs ordered are listed, but only abnormal results are displayed)  Labs Reviewed  COMPREHENSIVE METABOLIC PANEL - Abnormal; Notable for the following components:      Result Value   Sodium 133 (*)    Chloride 96 (*)    Glucose, Bld 443 (*)    Total Bilirubin 1.4 (*)    All other components within normal limits  URINALYSIS, COMPLETE (UACMP) WITH MICROSCOPIC - Abnormal; Notable for the following components:   Color, Urine STRAW (*)    APPearance CLEAR (*)    Specific Gravity, Urine 1.039 (*)    Glucose, UA >=500 (*)    Ketones, ur 20 (*)    All other components within normal limits  GLUCOSE, CAPILLARY - Abnormal; Notable for the following components:   Glucose-Capillary 440 (*)    All other components within normal limits  CBC WITH DIFFERENTIAL/PLATELET  CBG MONITORING, ED  CBG MONITORING, ED  CBG MONITORING, ED  CBG MONITORING, ED    ____________________________________________  EKG   ____________________________________________  RADIOLOGY   ____________________________________________   PROCEDURES  Procedure(s) performed: None  Procedures  Critical Care performed: No  ____________________________________________   INITIAL IMPRESSION / ASSESSMENT AND PLAN / ED COURSE  Pertinent labs & imaging results that were available during my care of the patient were reviewed by me and considered in my medical decision making (see chart for details).   Patient presents for evaluation of concerns for possible high blood sugar, does report polyuria polydipsia for last 2 to 3 days and has a reasonable cause for this as he ran out of his sulfonylurea about 2 weeks ago.  No other systemic symptoms, no weakness, reassuring hemodynamics.  Fully awake and alert without any confusion.  Will initiate IV fluids, use insulin IV.  A small bolus of insulin, also restart his sulfonylurea.  Will  continue check his blood sugars, goal to get his blood sugar less than 300 at which point I think he would be safe for discharge to continue his sulfonylurea which I have represcribed him for a month with a single refill.  Also strongly encouraged notify the patient he will need to set up outpatient follow-up and he reports he will call [facility] to [facility] appointment.  Return precautions and treatment recommendations and follow-up discussed with the patient who is agreeable with the plan.   Clinical Course as of Dec 23 1526  Sun Dec 22, 2017  1527 BP 118/61, patient resting comfortably.  Ongoing care assigned to Dr. Marisa SeverinSiadecki.Plan of care, giving fluids, oral sulfonylurea, and small doses of IV insulin to obtain improved blood sugar.  Patient asymptomatic at this time, once blood sugar less than approximately 300, would anticipate patient be discharged taking continue on his home glipizide which she reports is working well and  follow-up with [facility].  Patient is in agreement with this plan as well.   [MQ]    Clinical Course User Index [MQ] Sharyn CreamerQuale, Mark, MD     ____________________________________________   FINAL CLINICAL IMPRESSION(S) / ED DIAGNOSES  Final diagnoses:  Hyperglycemia  Type 2 diabetes mellitus without complication, without long-term current use of insulin (HCC)        Note:  This document was prepared using Dragon voice recognition software and may include unintentional dictation errors       Sharyn CreamerQuale, Mark, MD 12/22/17 1529

## 2018-01-29 ENCOUNTER — Emergency Department: Payer: Self-pay

## 2018-01-29 ENCOUNTER — Other Ambulatory Visit: Payer: Self-pay

## 2018-01-29 ENCOUNTER — Encounter: Payer: Self-pay | Admitting: Emergency Medicine

## 2018-01-29 ENCOUNTER — Emergency Department
Admission: EM | Admit: 2018-01-29 | Discharge: 2018-01-30 | Disposition: A | Discharge status: Home / Self Care | Payer: Self-pay | Attending: Emergency Medicine | Admitting: Emergency Medicine

## 2018-01-29 DIAGNOSIS — J101 Influenza due to other identified influenza virus with other respiratory manifestations: Secondary | ICD-10-CM | POA: Insufficient documentation

## 2018-01-29 DIAGNOSIS — E119 Type 2 diabetes mellitus without complications: Secondary | ICD-10-CM | POA: Insufficient documentation

## 2018-01-29 DIAGNOSIS — F17210 Nicotine dependence, cigarettes, uncomplicated: Secondary | ICD-10-CM | POA: Insufficient documentation

## 2018-01-29 DIAGNOSIS — Z7984 Long term (current) use of oral hypoglycemic drugs: Secondary | ICD-10-CM | POA: Insufficient documentation

## 2018-01-29 LAB — INFLUENZA PANEL BY PCR (TYPE A & B)
INFLAPCR: POSITIVE — AB
Influenza B By PCR: NEGATIVE

## 2018-01-29 MED ORDER — ACETAMINOPHEN 500 MG PO TABS
ORAL_TABLET | ORAL | Status: AC
  Filled 2018-01-29: qty 1

## 2018-01-29 MED ORDER — ACETAMINOPHEN 500 MG PO TABS
1000.0000 mg | ORAL_TABLET | Freq: Once | ORAL | Status: AC
  Administered 2018-01-29: 1000 mg via ORAL
  Filled 2018-01-29: qty 2

## 2018-01-29 MED ORDER — IPRATROPIUM-ALBUTEROL 0.5-2.5 (3) MG/3ML IN SOLN
3.0000 mL | Freq: Once | RESPIRATORY_TRACT | Status: AC
  Administered 2018-01-29: 3 mL via RESPIRATORY_TRACT
  Filled 2018-01-29: qty 3

## 2018-01-29 MED ORDER — IBUPROFEN 800 MG PO TABS
800.0000 mg | ORAL_TABLET | Freq: Once | ORAL | Status: AC
  Administered 2018-01-29: 800 mg via ORAL
  Filled 2018-01-29: qty 1

## 2018-01-29 MED ORDER — OSELTAMIVIR PHOSPHATE 75 MG PO CAPS: 75.0000 mg | ORAL_CAPSULE | Freq: Two times a day (BID) | ORAL | 0 refills | Status: DC

## 2018-01-29 NOTE — ED Provider Notes (Signed)
[facility] Emergency Department Provider Note  ____________________________________________   First MD Initiated Contact with Patient 01/29/18 2257     (approximate)  I have reviewed the triage vital signs and the nursing notes.   HISTORY  Chief Complaint Fever    HPI Anastasia PallScott D Duesing is a 33 y.o. male flulike symptoms, patient complained of fever, chills, body aches.  Dry cough,. Sore throat, denies vomiting, denies diarrhea; denies chest pain or sob.  Sx for 1 days   Past Medical History:  Diagnosis Date  . Diabetes mellitus without complication (HCC)     There are no active problems to display for this patient.   History reviewed. No pertinent surgical history.  Prior to Admission medications   Medication Sig Start Date End Date Taking? Authorizing Provider  dibucaine (NUPERCAINAL) 1 % OINT Place 1 application rectally as needed for pain. Patient not taking: Reported on 08/06/2016 06/03/16   Willy Eddyobinson, Patrick, MD  glipiZIDE (GLUCOTROL) 5 MG tablet Take 1 tablet (5 mg total) by mouth 2 (two) times daily. 12/22/17 02/20/18  Sharyn CreamerQuale, Mark, MD  hydrocortisone (ANUSOL-HC) 25 MG suppository Place 1 suppository (25 mg total) rectally 2 (two) times daily. Patient not taking: Reported on 08/06/2016 06/03/16   Willy Eddyobinson, Patrick, MD  metFORMIN (GLUCOPHAGE) 500 MG tablet Take 2 tablets (1,000 mg total) by mouth 2 (two) times daily with a meal. Patient not taking: Reported on 08/06/2016 06/01/16 06/01/17  Merrily Brittleifenbark, Neil, MD  oseltamivir (TAMIFLU) 75 MG capsule Take 1 capsule (75 mg total) by mouth 2 (two) times daily. 01/29/18   Fisher, Roselyn BeringSusan W, PA-C  polyethylene glycol (MIRALAX / Ethelene HalGLYCOLAX) packet Take 17 g by mouth daily. Mix one tablespoon with 8oz of your favorite juice or water every day until you are having soft formed stools. Then start taking once daily if you didn't have a stool the day before. Patient not taking: Reported on 08/06/2016 06/03/16   Willy Eddyobinson, Patrick, MD    ranitidine (ZANTAC) 150 MG tablet Take 1 tablet (150 mg total) by mouth 2 (two) times daily. 03/28/17 03/28/18  Schaevitz, Myra Rudeavid Matthew, MD    Allergies Patient has no known allergies.  No family history on file.  Social History Social History   Tobacco Use  . Smoking status: Current Every Day Smoker    Packs/day: 1.00    Types: Cigarettes  . Smokeless tobacco: Never Used  Substance Use Topics  . Alcohol use: Yes  . Drug use: No    Review of Systems  Constitutional: Positive fever/chills Eyes: No visual changes. ENT: Positive sore throat. Respiratory: Positive cough Genitourinary: Negative for dysuria. Musculoskeletal: Negative for back pain. Skin: Negative for rash.    ____________________________________________   PHYSICAL EXAM:  VITAL SIGNS: ED Triage Vitals [01/29/18 2156]  Enc Vitals Group     BP 119/70     Pulse Rate (!) 118     Resp 18     Temp (!) 102.6 F (39.2 C)     Temp Source Oral     SpO2 97 %     Weight 180 lb (81.6 kg)     Height 5\' 6"  (1.676 m)     Head Circumference      Peak Flow      Pain Score 7     Pain Loc      Pain Edu?      Excl. in GC?     Constitutional: Alert and oriented. Well appearing and in no acute distress. Eyes: Conjunctivae are normal.  Head: Atraumatic. Nose: No congestion/rhinnorhea. Mouth/Throat: Mucous membranes are moist.  Throat appears normal Neck:  supple no lymphadenopathy noted Cardiovascular: Normal rate, regular rhythm. Heart sounds are normal Respiratory: Normal respiratory effort.  No retractions, lungs c t a  GU: deferred Musculoskeletal: FROM all extremities, warm and well perfused Neurologic:  Normal speech and language.  Skin:  Skin is warm, dry and intact. No rash noted. Psychiatric: Mood and affect are normal. Speech and behavior are normal.  ____________________________________________   LABS (all labs ordered are listed, but only abnormal results are displayed)  Labs Reviewed   INFLUENZA PANEL BY PCR (TYPE A & B) - Abnormal; Notable for the following components:      Result Value   Influenza A By PCR POSITIVE (*)    All other components within normal limits   ____________________________________________   ____________________________________________  RADIOLOGY  Chest x-ray is negative for pneumonia  ____________________________________________   PROCEDURES  Procedure(s) performed: DuoNeb   Procedures    ____________________________________________   INITIAL IMPRESSION / ASSESSMENT AND PLAN / ED COURSE  Pertinent labs & imaging results that were available during my care of the patient were reviewed by me and considered in my medical decision making (see chart for details).   Patient is a 33 year old male presents emergency department flulike symptoms.  Physical exam shows a febrile tachycardic male.  Lungs are clear to all station.  Remainder exam is unremarkable  Influenza test is positive for influenza A  Explained test results to the patient.  He was given a prescription for Tamiflu. Upon discharge nurse noticed that the heart rate had increased from 1 18-130.  The the temperature had increased from 102.6-102.8 even after he had had Tylenol.  Instructed nursing staff to dose him with ibuprofen 800 mg p.o., give him a DuoNeb, and I ordered a chest x-ray.    ----------------------------------------- 12:24 AM on 01/30/2018 -----------------------------------------  Chest x-ray is negative.  Patient's vitals have improved with the ibuprofen and DuoNeb.  Will be discharged as previously instructed.    As part of my medical decision making, I reviewed the following data within the electronic MEDICAL RECORD NUMBER Nursing notes reviewed and incorporated, Labs reviewed influenza test is positive for influenza A, Old chart reviewed, Radiograph reviewed chest x-ray is negative for pneumonia, Notes from prior ED visits and [place] Controlled Substance  Database  ____________________________________________   FINAL CLINICAL IMPRESSION(S) / ED DIAGNOSES  Final diagnoses:  Influenza A      NEW MEDICATIONS STARTED DURING THIS VISIT:  New Prescriptions   OSELTAMIVIR (TAMIFLU) 75 MG CAPSULE    Take 1 capsule (75 mg total) by mouth 2 (two) times daily.     Note:  This document was prepared using Dragon voice recognition software and may include unintentional dictation errors.    Faythe GheeFisher, Susan W, PA-C 01/30/18 0024    Pershing ProudSchaevitz, Myra Rudeavid Matthew, MD 01/30/18 [phone]

## 2018-01-29 NOTE — ED Triage Notes (Signed)
Pt ambulatory to triage without difficulty or distress noted, mask in place; st since last night having fever, chills, cough & congestion; no meds taken PTA

## 2018-01-29 NOTE — Discharge Instructions (Addendum)
Follow-up with your regular doctor if not better in 3 days.  Return emergency department worsening.  Take Tamiflu as prescribed.  Tylenol and ibuprofen for fever as needed.  Drink plenty of fluids.  He should not return to work until you have been fever free for 24 to 48 hours.

## 2018-01-30 ENCOUNTER — Emergency Department: Payer: Self-pay

## 2018-05-28 ENCOUNTER — Other Ambulatory Visit: Payer: Self-pay

## 2018-05-28 ENCOUNTER — Encounter: Payer: Self-pay | Admitting: Emergency Medicine

## 2018-05-28 ENCOUNTER — Emergency Department
Admission: EM | Admit: 2018-05-28 | Discharge: 2018-05-28 | Disposition: A | Discharge status: Home / Self Care | Payer: Self-pay | Attending: Emergency Medicine | Admitting: Emergency Medicine

## 2018-05-28 DIAGNOSIS — R739 Hyperglycemia, unspecified: Secondary | ICD-10-CM

## 2018-05-28 DIAGNOSIS — E1165 Type 2 diabetes mellitus with hyperglycemia: Secondary | ICD-10-CM | POA: Insufficient documentation

## 2018-05-28 DIAGNOSIS — Z7984 Long term (current) use of oral hypoglycemic drugs: Secondary | ICD-10-CM | POA: Insufficient documentation

## 2018-05-28 DIAGNOSIS — F17210 Nicotine dependence, cigarettes, uncomplicated: Secondary | ICD-10-CM | POA: Insufficient documentation

## 2018-05-28 LAB — URINALYSIS, COMPLETE (UACMP) WITH MICROSCOPIC
Bacteria, UA: NONE SEEN
Bilirubin Urine: NEGATIVE
Glucose, UA: >=500 mg/dL — AB
Hgb urine dipstick: NEGATIVE
Ketones, ur: 20 mg/dL — AB
Leukocytes,Ua: NEGATIVE
Nitrite: NEGATIVE
Protein, ur: NEGATIVE mg/dL
Specific Gravity, Urine: 1.033 — ABNORMAL HIGH (ref 1.005–1.030)
pH: 6 (ref 5.0–8.0)

## 2018-05-28 LAB — CBC
HCT: 44.4 % (ref 39.0–52.0)
Hemoglobin: 15.5 g/dL (ref 13.0–17.0)
MCH: 28.6 pg (ref 26.0–34.0)
MCHC: 34.9 g/dL (ref 30.0–36.0)
MCV: 81.9 fL (ref 80.0–100.0)
Platelets: 245 10*3/uL (ref 150–400)
RBC: 5.42 MIL/uL (ref 4.22–5.81)
RDW: 12.4 % (ref 11.5–15.5)
WBC: 5.7 10*3/uL (ref 4.0–10.5)
nRBC: 0 % (ref 0.0–0.2)

## 2018-05-28 LAB — GLUCOSE, CAPILLARY
Glucose-Capillary: 350 mg/dL — ABNORMAL HIGH (ref 70–99)
Glucose-Capillary: 265 mg/dL — ABNORMAL HIGH (ref 70–99)

## 2018-05-28 LAB — BASIC METABOLIC PANEL
Anion gap: 14 (ref 5–15)
BUN: 18 mg/dL (ref 6–20)
CO2: 20 mmol/L — ABNORMAL LOW (ref 22–32)
Calcium: 9.3 mg/dL (ref 8.9–10.3)
Chloride: 100 mmol/L (ref 98–111)
Creatinine, Ser: 0.73 mg/dL (ref 0.61–1.24)
GFR calc Af Amer: >60 mL/min (ref >60)
GFR calc non Af Amer: >60 mL/min (ref >60)
Glucose, Bld: 347 mg/dL — ABNORMAL HIGH (ref 70–99)
Potassium: 3.7 mmol/L (ref 3.5–5.1)
Sodium: 134 mmol/L — ABNORMAL LOW (ref 135–145)

## 2018-05-28 MED ORDER — GLIPIZIDE 5 MG PO TABS: 5.0000 mg | ORAL_TABLET | Freq: Two times a day (BID) | ORAL | 1 refills | Status: DC

## 2018-05-28 MED ORDER — SODIUM CHLORIDE 0.9 % IV BOLUS
1000.0000 mL | Freq: Once | INTRAVENOUS | Status: AC
  Administered 2018-05-28: 1000 mL via INTRAVENOUS

## 2018-05-28 NOTE — ED Provider Notes (Signed)
[facility] Emergency Department Provider Note  ____________________________________________  Time seen: Approximately 4:00 PM  I have reviewed the triage vital signs and the nursing notes.   HISTORY  Chief Complaint Hyperglycemia    HPI ERVINE WITUCKI is a 33 y.o. male that presents to the emergency department for evaluation of high blood sugar for at least several days.  Patient states that he does not have a primary care.  He usually gets his glipizide here in the emergency department.  He ran out of his glipizide 2 or 3 weeks ago.  He notices that he urinates more at night without his glipizide.  Otherwise, he feels well.  No additional complaints.   Past Medical History:  Diagnosis Date  . Diabetes mellitus without complication (HCC)     There are no active problems to display for this patient.   History reviewed. No pertinent surgical history.  Prior to Admission medications   Medication Sig Start Date End Date Taking? Authorizing Provider  dibucaine (NUPERCAINAL) 1 % OINT Place 1 application rectally as needed for pain. Patient not taking: Reported on 08/06/2016 06/03/16   Willy Eddy, MD  glipiZIDE (GLUCOTROL) 5 MG tablet Take 1 tablet (5 mg total) by mouth 2 (two) times daily. 05/28/18 07/27/18  Enid Derry, PA-C  hydrocortisone (ANUSOL-HC) 25 MG suppository Place 1 suppository (25 mg total) rectally 2 (two) times daily. Patient not taking: Reported on 08/06/2016 06/03/16   Willy Eddy, MD  metFORMIN (GLUCOPHAGE) 500 MG tablet Take 2 tablets (1,000 mg total) by mouth 2 (two) times daily with a meal. Patient not taking: Reported on 08/06/2016 06/01/16 06/01/17  Merrily Brittle, MD  oseltamivir (TAMIFLU) 75 MG capsule Take 1 capsule (75 mg total) by mouth 2 (two) times daily. 01/29/18   Fisher, Roselyn Bering, PA-C  polyethylene glycol (MIRALAX / Ethelene Hal) packet Take 17 g by mouth daily. Mix one tablespoon with 8oz of your favorite juice or water every day  until you are having soft formed stools. Then start taking once daily if you didn't have a stool the day before. Patient not taking: Reported on 08/06/2016 06/03/16   Willy Eddy, MD  ranitidine (ZANTAC) 150 MG tablet Take 1 tablet (150 mg total) by mouth 2 (two) times daily. 03/28/17 03/28/18  Schaevitz, Myra Rude, MD    Allergies Patient has no known allergies.  No family history on file.  Social History Social History   Tobacco Use  . Smoking status: Current Every Day Smoker    Packs/day: 1.00    Types: Cigarettes  . Smokeless tobacco: Never Used  Substance Use Topics  . Alcohol use: Yes  . Drug use: No     Review of Systems  Constitutional: No fever/chills ENT: No upper respiratory complaints. Cardiovascular: No chest pain. Respiratory: No SOB. Gastrointestinal: No abdominal pain.  No nausea, no vomiting.  Musculoskeletal: Negative for musculoskeletal pain. Skin: Negative for rash, abrasions, lacerations, ecchymosis.   ____________________________________________   PHYSICAL EXAM:  VITAL SIGNS: ED Triage Vitals  Enc Vitals Group     BP 05/28/18 1437 138/74     Pulse Rate 05/28/18 1437 95     Resp 05/28/18 1437 16     Temp 05/28/18 1437 98.5 F (36.9 C)     Temp Source 05/28/18 1437 Oral     SpO2 05/28/18 1437 98 %     Weight 05/28/18 1436 185 lb (83.9 kg)     Height 05/28/18 1436  (1.676 m)     Head Circumference --  Peak Flow --      Pain Score 05/28/18 1436 0     Pain Loc --      Pain Edu? --      Excl. in GC? --      Constitutional: Alert and oriented. Well appearing and in no acute distress. Eyes: Conjunctivae are normal. PERRL. EOMI. Head: Atraumatic. ENT:      Ears:      Nose: No congestion/rhinnorhea.      Mouth/Throat: Mucous membranes are moist.  Neck: No stridor.  Cardiovascular: Normal rate, regular rhythm.  Good peripheral circulation. Respiratory: Normal respiratory effort without tachypnea or retractions. Lungs CTAB.  Good air entry to the bases with no decreased or absent breath sounds. Gastrointestinal: Bowel sounds 4 quadrants. Soft and nontender to palpation. No guarding or rigidity. No palpable masses. No distention. Musculoskeletal: Full range of motion to all extremities. No gross deformities appreciated. Neurologic:  Normal speech and language. No gross focal neurologic deficits are appreciated.  Skin:  Skin is warm, dry and intact. No rash noted. Psychiatric: Mood and affect are normal. Speech and behavior are normal. Patient exhibits appropriate insight and judgement.   ____________________________________________   LABS (all labs ordered are listed, but only abnormal results are displayed)  Labs Reviewed  BASIC METABOLIC PANEL - Abnormal; Notable for the following components:      Result Value   Sodium 134 (*)    CO2 20 (*)    Glucose, Bld 347 (*)    All other components within normal limits  URINALYSIS, COMPLETE (UACMP) WITH MICROSCOPIC - Abnormal; Notable for the following components:   Color, Urine STRAW (*)    APPearance CLEAR (*)    Specific Gravity, Urine 1.033 (*)    Glucose, UA >=500 (*)    Ketones, ur 20 (*)    All other components within normal limits  GLUCOSE, CAPILLARY - Abnormal; Notable for the following components:   Glucose-Capillary 350 (*)    All other components within normal limits  GLUCOSE, CAPILLARY - Abnormal; Notable for the following components:   Glucose-Capillary 265 (*)    All other components within normal limits  CBC  CBG MONITORING, ED  CBG MONITORING, ED   ____________________________________________  EKG   ____________________________________________  RADIOLOGY   No results found.  ____________________________________________    PROCEDURES  Procedure(s) performed:    Procedures    Medications  sodium chloride 0.9 % bolus 1,000 mL (0 mLs Intravenous Stopped 05/28/18 1747)      ____________________________________________   INITIAL IMPRESSION / ASSESSMENT AND PLAN / ED COURSE  Pertinent labs & imaging results that were available during my care of the patient were reviewed by me and considered in my medical decision making (see chart for details).  Review of the [place] CSRS was performed in accordance of the [facility] prior to dispensing any controlled drugs.     Patient's diagnosis is consistent with diabetes.  Blood sugar came down with fluids.  Patient feels well.  Patient will be discharged home with prescriptions for glipizide. Patient is to follow up with primary care as directed.  [facility].  Patient is given ED precautions to return to the ED for any worsening or new symptoms.     ____________________________________________  FINAL CLINICAL IMPRESSION(S) / ED DIAGNOSES  Final diagnoses:  Hyperglycemia      NEW MEDICATIONS STARTED DURING THIS VISIT:  ED Discharge Orders         Ordered    glipiZIDE (GLUCOTROL)  5 MG tablet  2 times daily     05/28/18 1806              This chart was dictated using voice recognition software/Dragon. Despite best efforts to proofread, errors can occur which can change the meaning. Any change was purely unintentional.    Enid Derry, PA-C 05/29/18 0003    Arnaldo Natal, MD 05/29/18 [phone]

## 2018-05-28 NOTE — ED Notes (Signed)
Report ran out of his diabetic medication 2-3 weeks ago. Reports does not have a primary physician, last percriptions was given to him in ED during last visit.

## 2018-05-28 NOTE — ED Triage Notes (Signed)
Pt here with c/o hyperglycemia, bg at home 379, ran out of Glipizide 2 or 3 weeks ago, tried to get in to be seen but unable. Denies complaints at this time.

## 2018-05-28 NOTE — Discharge Instructions (Signed)
I have refilled your glipizide for a short time until you can establish with primary care.  I have given you a referral to [facility].

## 2018-05-28 NOTE — ED Notes (Signed)
Blood Glucose 265 .Marland KitchenMarland Kitchen

## 2018-10-28 ENCOUNTER — Emergency Department
Admission: EM | Admit: 2018-10-28 | Discharge: 2018-10-28 | Disposition: A | Discharge status: Home / Self Care | Payer: Self-pay | Attending: Emergency Medicine | Admitting: Emergency Medicine

## 2018-10-28 ENCOUNTER — Encounter: Payer: Self-pay | Admitting: Emergency Medicine

## 2018-10-28 ENCOUNTER — Other Ambulatory Visit: Payer: Self-pay

## 2018-10-28 DIAGNOSIS — R739 Hyperglycemia, unspecified: Secondary | ICD-10-CM

## 2018-10-28 DIAGNOSIS — Z79899 Other long term (current) drug therapy: Secondary | ICD-10-CM | POA: Insufficient documentation

## 2018-10-28 DIAGNOSIS — E1165 Type 2 diabetes mellitus with hyperglycemia: Secondary | ICD-10-CM | POA: Insufficient documentation

## 2018-10-28 DIAGNOSIS — F17210 Nicotine dependence, cigarettes, uncomplicated: Secondary | ICD-10-CM | POA: Insufficient documentation

## 2018-10-28 LAB — BASIC METABOLIC PANEL
Anion gap: 13 (ref 5–15)
BUN: 14 mg/dL (ref 6–20)
CO2: 22 mmol/L (ref 22–32)
Calcium: 9.6 mg/dL (ref 8.9–10.3)
Chloride: 101 mmol/L (ref 98–111)
Creatinine, Ser: 0.74 mg/dL (ref 0.61–1.24)
GFR calc Af Amer: >60 mL/min (ref >60)
GFR calc non Af Amer: >60 mL/min (ref >60)
Glucose, Bld: 379 mg/dL — ABNORMAL HIGH (ref 70–99)
Potassium: 3.9 mmol/L (ref 3.5–5.1)
Sodium: 136 mmol/L (ref 135–145)

## 2018-10-28 LAB — CBC
HCT: 45.4 % (ref 39.0–52.0)
Hemoglobin: 15.8 g/dL (ref 13.0–17.0)
MCH: 28.1 pg (ref 26.0–34.0)
MCHC: 34.8 g/dL (ref 30.0–36.0)
MCV: 80.6 fL (ref 80.0–100.0)
Platelets: 217 10*3/uL (ref 150–400)
RBC: 5.63 MIL/uL (ref 4.22–5.81)
RDW: 12.4 % (ref 11.5–15.5)
WBC: 5 10*3/uL (ref 4.0–10.5)
nRBC: 0 % (ref 0.0–0.2)

## 2018-10-28 LAB — URINALYSIS, COMPLETE (UACMP) WITH MICROSCOPIC
Bacteria, UA: NONE SEEN
Bilirubin Urine: NEGATIVE
Glucose, UA: >=500 mg/dL — AB
Hgb urine dipstick: NEGATIVE
Ketones, ur: 80 mg/dL — AB
Leukocytes,Ua: NEGATIVE
Nitrite: NEGATIVE
Protein, ur: NEGATIVE mg/dL
Specific Gravity, Urine: 1.041 — ABNORMAL HIGH (ref 1.005–1.030)
Squamous Epithelial / HPF: NONE SEEN (ref 0–5)
pH: 5 (ref 5.0–8.0)

## 2018-10-28 LAB — GLUCOSE, CAPILLARY
Glucose-Capillary: 360 mg/dL — ABNORMAL HIGH (ref 70–99)
Glucose-Capillary: 279 mg/dL — ABNORMAL HIGH (ref 70–99)

## 2018-10-28 MED ORDER — SODIUM CHLORIDE 0.9 % IV BOLUS
1000.0000 mL | Freq: Once | INTRAVENOUS | Status: AC
  Administered 2018-10-28: 1000 mL via INTRAVENOUS

## 2018-10-28 MED ORDER — GLIPIZIDE 5 MG PO TABS: 5.0000 mg | ORAL_TABLET | Freq: Two times a day (BID) | ORAL | 1 refills | Status: DC

## 2018-10-28 NOTE — Discharge Instructions (Addendum)
Please seek medical attention for any high fevers, chest pain, shortness of breath, change in behavior, persistent vomiting, bloody stool or any other new or concerning symptoms.  

## 2018-10-28 NOTE — ED Provider Notes (Signed)
[facility] Emergency Department Provider Note  ____________________________________________   I have reviewed the triage vital signs and the nursing notes.   HISTORY  Chief Complaint Hyperglycemia   History limited by: Not Limited   HPI Barry Jennings is a 33 y.o. male who presents to the emergency department today because of concern for elevated blood sugar. Patient has history of diabetes. States he has been out of his medication for the past 2 weeks. Does not have a primary care physician. Says that he gets has gotten his prescription from the ED in the past. He says that along with the elevated blood sugar he has had increased urinary frequency and headache.   Records reviewed. Per medical record review patient has a history of emergency department visits for hyperglycemia in the past.   Past Medical History:  Diagnosis Date  . Diabetes mellitus without complication (HCC)     There are no active problems to display for this patient.   History reviewed. No pertinent surgical history.  Prior to Admission medications   Medication Sig Start Date End Date Taking? Authorizing Provider  dibucaine (NUPERCAINAL) 1 % OINT Place 1 application rectally as needed for pain. Patient not taking: Reported on 08/06/2016 06/03/16   Willy Eddy, MD  glipiZIDE (GLUCOTROL) 5 MG tablet Take 1 tablet (5 mg total) by mouth 2 (two) times daily. 05/28/18 07/27/18  Enid Derry, PA-C  hydrocortisone (ANUSOL-HC) 25 MG suppository Place 1 suppository (25 mg total) rectally 2 (two) times daily. Patient not taking: Reported on 08/06/2016 06/03/16   Willy Eddy, MD  metFORMIN (GLUCOPHAGE) 500 MG tablet Take 2 tablets (1,000 mg total) by mouth 2 (two) times daily with a meal. Patient not taking: Reported on 08/06/2016 06/01/16 06/01/17  Merrily Brittle, MD  oseltamivir (TAMIFLU) 75 MG capsule Take 1 capsule (75 mg total) by mouth 2 (two) times daily. 01/29/18   Fisher, Roselyn Bering, PA-C   polyethylene glycol (MIRALAX / Ethelene Hal) packet Take 17 g by mouth daily. Mix one tablespoon with 8oz of your favorite juice or water every day until you are having soft formed stools. Then start taking once daily if you didn't have a stool the day before. Patient not taking: Reported on 08/06/2016 06/03/16   Willy Eddy, MD  ranitidine (ZANTAC) 150 MG tablet Take 1 tablet (150 mg total) by mouth 2 (two) times daily. 03/28/17 03/28/18  Schaevitz, Myra Rude, MD    Allergies Patient has no known allergies.  No family history on file.  Social History Social History   Tobacco Use  . Smoking status: Current Every Day Smoker    Packs/day: 1.00    Types: Cigarettes  . Smokeless tobacco: Never Used  Substance Use Topics  . Alcohol use: Yes  . Drug use: No    Review of Systems Constitutional: No fever/chills Eyes: No visual changes. ENT: No sore throat. Cardiovascular: Denies chest pain. Respiratory: Denies shortness of breath. Gastrointestinal: No abdominal pain.  No nausea, no vomiting.  No diarrhea.   Genitourinary: Positive for increased urinary frequency.  Musculoskeletal: Negative for back pain. Skin: Negative for rash. Neurological: Positive for headache.  ____________________________________________   PHYSICAL EXAM:  VITAL SIGNS: ED Triage Vitals  Enc Vitals Group     BP 10/28/18 1253 104/76     Pulse Rate 10/28/18 1253 100     Resp 10/28/18 1253 18     Temp 10/28/18 1253 98.9 F (37.2 C)     Temp Source 10/28/18 1253 Oral  SpO2 10/28/18 1253 99 %     Weight 10/28/18 1254 181 lb (82.1 kg)     Height 10/28/18 1254 5\' 6"  (1.676 m)     Head Circumference --      Peak Flow --      Pain Score 10/28/18 1300 0   Constitutional: Alert and oriented.  Eyes: Conjunctivae are normal.  ENT      Head: Normocephalic and atraumatic.      Nose: No congestion/rhinnorhea.      Mouth/Throat: Mucous membranes are moist.      Neck: No  stridor. Hematological/Lymphatic/Immunilogical: No cervical lymphadenopathy. Cardiovascular: Normal rate, regular rhythm.  No murmurs, rubs, or gallops. Respiratory: Normal respiratory effort without tachypnea nor retractions. Breath sounds are clear and equal bilaterally. No wheezes/rales/rhonchi. Gastrointestinal: Soft and non tender. No rebound. No guarding.  Genitourinary: Deferred Musculoskeletal: Normal range of motion in all extremities. No lower extremity edema. Neurologic:  Normal speech and language. No gross focal neurologic deficits are appreciated.  Skin:  Skin is warm, dry and intact. No rash noted. Psychiatric: Mood and affect are normal. Speech and behavior are normal. Patient exhibits appropriate insight and judgment.  ____________________________________________    LABS (pertinent positives/negatives)  BMP wnl except glu 379. Anion gap 13 UA clear, ketones 80, 0-5 rbc and wbc CBC wbc 5.0, hgb 15.8, plt 217  ____________________________________________   EKG  None  ____________________________________________    RADIOLOGY  None  ____________________________________________   PROCEDURES  Procedures  ____________________________________________   INITIAL IMPRESSION / ASSESSMENT AND PLAN / ED COURSE  Pertinent labs & imaging results that were available during my care of the patient were reviewed by me and considered in my medical decision making (see chart for details).   Patient presents to the emergency department today because of concerns for elevated blood sugar in the setting of running out of his medication 2 weeks ago.  Since blood work does show elevated blood sugar however no anion gap to suggest diabetic ketoacidosis.  Patient was given IV fluids which did still bring his sugars down.  Discussed with patient importance of obtaining primary care physician.  Will give patient prescription for his diabetic  medication.  ____________________________________________   FINAL CLINICAL IMPRESSION(S) / ED DIAGNOSES  Final diagnoses:  Hyperglycemia     Note: This dictation was prepared with Dragon dictation. Any transcriptional errors that result from this process are unintentional     Nance Pear, MD 10/28/18 1537

## 2018-10-28 NOTE — ED Notes (Signed)
Verbal order for NS bolus given by Dr. Cherylann Banas

## 2018-10-28 NOTE — ED Triage Notes (Signed)
Pt reports his blood sugar has been running high and he has been urinating for frequently. Pt states ran out of his diabetes meds 2 weeks ago.

## 2019-03-16 ENCOUNTER — Other Ambulatory Visit: Payer: Self-pay

## 2019-03-16 ENCOUNTER — Emergency Department
Admission: EM | Admit: 2019-03-16 | Discharge: 2019-03-16 | Disposition: A | Discharge status: Home / Self Care | Payer: Self-pay | Attending: Emergency Medicine | Admitting: Emergency Medicine

## 2019-03-16 ENCOUNTER — Encounter: Payer: Self-pay | Admitting: Emergency Medicine

## 2019-03-16 DIAGNOSIS — Z760 Encounter for issue of repeat prescription: Secondary | ICD-10-CM | POA: Insufficient documentation

## 2019-03-16 DIAGNOSIS — E1165 Type 2 diabetes mellitus with hyperglycemia: Secondary | ICD-10-CM | POA: Insufficient documentation

## 2019-03-16 DIAGNOSIS — E119 Type 2 diabetes mellitus without complications: Secondary | ICD-10-CM

## 2019-03-16 DIAGNOSIS — Z7984 Long term (current) use of oral hypoglycemic drugs: Secondary | ICD-10-CM | POA: Insufficient documentation

## 2019-03-16 DIAGNOSIS — F17210 Nicotine dependence, cigarettes, uncomplicated: Secondary | ICD-10-CM | POA: Insufficient documentation

## 2019-03-16 LAB — URINALYSIS, COMPLETE (UACMP) WITH MICROSCOPIC
Bacteria, UA: NONE SEEN
Bilirubin Urine: NEGATIVE
Glucose, UA: >=500 mg/dL — AB
Hgb urine dipstick: NEGATIVE
Ketones, ur: 20 mg/dL — AB
Leukocytes,Ua: NEGATIVE
Nitrite: NEGATIVE
Protein, ur: NEGATIVE mg/dL
Specific Gravity, Urine: 1.039 — ABNORMAL HIGH (ref 1.005–1.030)
Squamous Epithelial / HPF: NONE SEEN (ref 0–5)
pH: 5 (ref 5.0–8.0)

## 2019-03-16 LAB — BASIC METABOLIC PANEL
Anion gap: 12 (ref 5–15)
BUN: 14 mg/dL (ref 6–20)
CO2: 22 mmol/L (ref 22–32)
Calcium: 9.4 mg/dL (ref 8.9–10.3)
Chloride: 105 mmol/L (ref 98–111)
Creatinine, Ser: 0.79 mg/dL (ref 0.61–1.24)
GFR calc Af Amer: >60 mL/min (ref >60)
GFR calc non Af Amer: >60 mL/min (ref >60)
Glucose, Bld: 339 mg/dL — ABNORMAL HIGH (ref 70–99)
Potassium: 3.8 mmol/L (ref 3.5–5.1)
Sodium: 139 mmol/L (ref 135–145)

## 2019-03-16 LAB — CBC
HCT: 47.5 % (ref 39.0–52.0)
Hemoglobin: 16.2 g/dL (ref 13.0–17.0)
MCH: 28.2 pg (ref 26.0–34.0)
MCHC: 34.1 g/dL (ref 30.0–36.0)
MCV: 82.8 fL (ref 80.0–100.0)
Platelets: 217 10*3/uL (ref 150–400)
RBC: 5.74 MIL/uL (ref 4.22–5.81)
RDW: 12.6 % (ref 11.5–15.5)
WBC: 4.5 10*3/uL (ref 4.0–10.5)
nRBC: 0 % (ref 0.0–0.2)

## 2019-03-16 MED ORDER — GLIPIZIDE 5 MG PO TABS: 5.0000 mg | ORAL_TABLET | Freq: Two times a day (BID) | ORAL | 1 refills | Status: DC

## 2019-03-16 MED ORDER — GLIPIZIDE 5 MG PO TABS: 5.0000 mg | ORAL_TABLET | Freq: Two times a day (BID) | ORAL | 0 refills | Status: DC

## 2019-03-16 NOTE — ED Triage Notes (Signed)
Says he is out of meds.  Has cut out concentrated sugars.  Says it was 3hundres something.

## 2019-03-16 NOTE — ED Triage Notes (Signed)
Ambulatory and in nad.  Here for elevated blood sugar.

## 2019-03-16 NOTE — ED Notes (Signed)
See triage note  Presents with some problems with his blood sugars  States he ran out of his meds about 2 weeks ago

## 2019-03-16 NOTE — ED Provider Notes (Signed)
[facility] Emergency Department Provider Note  ____________________________________________  Time seen: Approximately 12:30 PM  I have reviewed the triage vital signs and the nursing notes.   HISTORY  Chief Complaint Hyperglycemia    HPI Barry Jennings is a 34 y.o. male that presents to the emergency department requesting medication refill of his glipizide.  Patient states that he ran out of medication about 2 weeks ago.  He has noticed the last couple of days that his blood sugars have been running in the 300s.  Overall he feels well.  He is currently in the process of some insurance changes and and waiting to get a new primary care provider.  Past Medical History:  Diagnosis Date  . Diabetes mellitus without complication (HCC)     There are no problems to display for this patient.   History reviewed. No pertinent surgical history.  Prior to Admission medications   Medication Sig Start Date End Date Taking? Authorizing Provider  glipiZIDE (GLUCOTROL) 5 MG tablet Take 1 tablet (5 mg total) by mouth 2 (two) times daily. 03/16/19 05/15/19  Enid Derry, PA-C    Allergies Patient has no known allergies.  No family history on file.  Social History Social History   Tobacco Use  . Smoking status: Current Every Day Smoker    Packs/day: 1.00    Types: Cigarettes  . Smokeless tobacco: Never Used  Substance Use Topics  . Alcohol use: Not Currently  . Drug use: No     Review of Systems  Cardiovascular: No chest pain. Respiratory: No SOB. Gastrointestinal: No abdominal pain.  No nausea, no vomiting.  Genitourinary: Negative for dysuria. Musculoskeletal: Negative for musculoskeletal pain. Skin: Negative for rash, abrasions, lacerations, ecchymosis. Neurological: Negative for headaches    ____________________________________________   PHYSICAL EXAM:  VITAL SIGNS: ED Triage Vitals  Enc Vitals Group     BP 03/16/19 1155 122/79     Pulse  Rate 03/16/19 1155 77     Resp 03/16/19 1155 16     Temp 03/16/19 1155 97.9 F (36.6 C)     Temp Source 03/16/19 1155 Oral     SpO2 03/16/19 1155 98 %     Weight 03/16/19 0958 185 lb (83.9 kg)     Height 03/16/19 0958 5\' 7"  (1.702 m)     Head Circumference --      Peak Flow --      Pain Score 03/16/19 0958 0     Pain Loc --      Pain Edu? --      Excl. in GC? --      Constitutional: Alert and oriented. Well appearing and in no acute distress. Eyes: Conjunctivae are normal. PERRL. EOMI. Head: Atraumatic. ENT:      Ears:      Nose: No congestion/rhinnorhea.      Mouth/Throat: Mucous membranes are moist.  Neck: No stridor.  Cardiovascular: Normal rate, regular rhythm.  Good peripheral circulation. Respiratory: Normal respiratory effort without tachypnea or retractions. Lungs CTAB. Good air entry to the bases with no decreased or absent breath sounds. Musculoskeletal: Full range of motion to all extremities. No gross deformities appreciated. Neurologic:  Normal speech and language. No gross focal neurologic deficits are appreciated.  Skin:  Skin is warm, dry and intact. No rash noted. Psychiatric: Mood and affect are normal. Speech and behavior are normal. Patient exhibits appropriate insight and judgement.   ____________________________________________   LABS (all labs ordered are listed, but only abnormal results are  displayed)  Labs Reviewed  BASIC METABOLIC PANEL - Abnormal; Notable for the following components:      Result Value   Glucose, Bld 339 (*)    All other components within normal limits  URINALYSIS, COMPLETE (UACMP) WITH MICROSCOPIC - Abnormal; Notable for the following components:   Color, Urine YELLOW (*)    APPearance CLEAR (*)    Specific Gravity, Urine 1.039 (*)    Glucose, UA >=500 (*)    Ketones, ur 20 (*)    All other components within normal limits  CBC  CBG MONITORING, ED  CBG MONITORING, ED    ____________________________________________  EKG   ____________________________________________  RADIOLOGY   No results found.  ____________________________________________    PROCEDURES  Procedure(s) performed:    Procedures    Medications - No data to display   ____________________________________________   INITIAL IMPRESSION / ASSESSMENT AND PLAN / ED COURSE  Pertinent labs & imaging results that were available during my care of the patient were reviewed by me and considered in my medical decision making (see chart for details).  Review of the [place] CSRS was performed in accordance of the [place] prior to dispensing any controlled drugs.  Patient presented to the emergency department for medication refill.  Vital signs and exam are reassuring.  Urinalysis is noncontributory for cystitis.  Blood glucose elevated at 339 but remaining CBC and BMP are unremarkable.  Patient declines IV fluids for hyperglycemia at this time.  He will pick up his medication and begin them this afternoon.  Patient will be discharged home with prescriptions for glipizide. Patient is to follow up with primary care as directed. Patient is given ED precautions to return to the ED for any worsening or new symptoms.  Barry Jennings was evaluated in Emergency Department on 03/16/2019 for the symptoms described in the history of present illness. He was evaluated in the context of the global COVID-19 pandemic, which necessitated consideration that the patient might be at risk for infection with the SARS-CoV-2 virus that causes COVID-19. Institutional protocols and algorithms that pertain to the evaluation of patients at risk for COVID-19 are in a state of rapid change based on information released by regulatory bodies including the CDC and federal and state organizations. These policies and algorithms were followed during the patient's care in the ED.   ____________________________________________  FINAL  CLINICAL IMPRESSION(S) / ED DIAGNOSES  Final diagnoses:  Type 2 diabetes mellitus without complication, without long-term current use of insulin (HCC)      NEW MEDICATIONS STARTED DURING THIS VISIT:  ED Discharge Orders         Ordered    glipiZIDE (GLUCOTROL) 5 MG tablet  2 times daily,   Status:  Discontinued     03/16/19 1259    glipiZIDE (GLUCOTROL) 5 MG tablet  2 times daily     03/16/19 1301              This chart was dictated using voice recognition software/Dragon. Despite best efforts to proofread, errors can occur which can change the meaning. Any change was purely unintentional.    Laban Emperor, PA-C 03/16/19 1421    Carrie Mew, MD 03/17/19 1540

## 2019-06-10 ENCOUNTER — Emergency Department
Admission: EM | Admit: 2019-06-10 | Discharge: 2019-06-10 | Disposition: A | Discharge status: Home / Self Care | Payer: Self-pay | Attending: Emergency Medicine | Admitting: Emergency Medicine

## 2019-06-10 ENCOUNTER — Other Ambulatory Visit: Payer: Self-pay

## 2019-06-10 ENCOUNTER — Encounter: Payer: Self-pay | Admitting: Emergency Medicine

## 2019-06-10 DIAGNOSIS — Z7984 Long term (current) use of oral hypoglycemic drugs: Secondary | ICD-10-CM | POA: Insufficient documentation

## 2019-06-10 DIAGNOSIS — E119 Type 2 diabetes mellitus without complications: Secondary | ICD-10-CM | POA: Insufficient documentation

## 2019-06-10 DIAGNOSIS — F17210 Nicotine dependence, cigarettes, uncomplicated: Secondary | ICD-10-CM | POA: Insufficient documentation

## 2019-06-10 DIAGNOSIS — E0800 Diabetes mellitus due to underlying condition with hyperosmolarity without nonketotic hyperglycemic-hyperosmolar coma (NKHHC): Secondary | ICD-10-CM

## 2019-06-10 DIAGNOSIS — Z760 Encounter for issue of repeat prescription: Secondary | ICD-10-CM | POA: Insufficient documentation

## 2019-06-10 LAB — GLUCOSE, CAPILLARY: Glucose-Capillary: 299 mg/dL — ABNORMAL HIGH (ref 70–99)

## 2019-06-10 MED ORDER — GLIPIZIDE 5 MG PO TABS: 5.0000 mg | ORAL_TABLET | Freq: Two times a day (BID) | ORAL | 0 refills | Status: DC

## 2019-06-10 NOTE — ED Triage Notes (Signed)
Pt here for medication refill.  Needs his glipizide. Got medicaid but his new provider today told him his card was a "covid test medicaid" and they would only see him if he paid cash.  Pt does not want to be seen for blood sugar only to get medications. No sx.

## 2019-06-10 NOTE — ED Provider Notes (Signed)
[facility] Emergency Department Provider Note   ____________________________________________   First MD Initiated Contact with Patient 06/10/19 1124     (approximate)  I have reviewed the triage vital signs and the nursing notes.   HISTORY  Chief Complaint Medication Refill    HPI Barry Jennings is a 34 y.o. male patient presents to ED requesting refill of Glucotrol for diabetes.  Patient continues to use the emergency room for refills of this medication.  Patient has [facility].  Patient stated do not want any blood work done today just refilled medications.     Past Medical History:  Diagnosis Date  . Diabetes mellitus without complication (HCC)     There are no problems to display for this patient.   History reviewed. No pertinent surgical history.  Prior to Admission medications   Medication Sig Start Date End Date Taking? Authorizing Provider  glipiZIDE (GLUCOTROL) 5 MG tablet Take 1 tablet (5 mg total) by mouth 2 (two) times daily. 06/10/19 08/09/19  Joni Reining, PA-C    Allergies Patient has no known allergies.  History reviewed. No pertinent family history.  Social History Social History   Tobacco Use  . Smoking status: Current Every Day Smoker    Packs/day: 1.00    Types: Cigarettes  . Smokeless tobacco: Never Used  Substance Use Topics  . Alcohol use: Not Currently  . Drug use: No    Review of Systems Constitutional: No fever/chills Eyes: No visual changes. ENT: No sore throat. Cardiovascular: Denies chest pain. Respiratory: Denies shortness of breath. Gastrointestinal: No abdominal pain.  No nausea, no vomiting.  No diarrhea.  No constipation. Genitourinary: Negative for dysuria. Musculoskeletal: Negative for back pain. Skin: Negative for rash. Neurological: Negative for headaches, focal weakness or numbness. Endocrine:   Diabetes  ____________________________________________   PHYSICAL EXAM:  VITAL SIGNS: ED Triage Vitals [06/10/19 1052]  Enc Vitals Group     BP 119/73     Pulse Rate 98     Resp 18     Temp 99 F (37.2 C)     Temp Source Oral     SpO2 98 %     Weight 186 lb (84.4 kg)     Height 5\' 7"  (1.702 m)     Head Circumference      Peak Flow      Pain Score 0     Pain Loc      Pain Edu?      Excl. in GC?    Constitutional: Alert and oriented. Well appearing and in no acute distress. Cardiovascular: Normal rate, regular rhythm. Grossly normal heart sounds.  Good peripheral circulation. Respiratory: Normal respiratory effort.  No retractions. Lungs CTAB.  ____________________________________________   LABS (all labs ordered are listed, but only abnormal results are displayed)  Labs Reviewed  GLUCOSE, CAPILLARY - Abnormal; Notable for the following components:      Result Value   Glucose-Capillary 299 (*)    All other components within normal limits   ____________________________________________  EKG   ____________________________________________  RADIOLOGY  ED MD interpretation:    Official radiology report(s): No results found.  ____________________________________________   PROCEDURES  Procedure(s) performed (including Critical Care):  Procedures   ____________________________________________   INITIAL IMPRESSION / ASSESSMENT AND PLAN / ED COURSE  As part of my medical decision making, I reviewed the following data within the electronic MEDICAL RECORD NUMBER     Patient presents with medication refill good  control for diabetes.  Patient will be given 1 month supply of medication.  Advised patient that the emergency room cannot be used for routine medication refills.  Advised on the risk factors on not having a PCP to monitor his chronic medical condition.  Advised patient to [facility].    Barry Jennings was evaluated in  Emergency Department on 06/10/2019 for the symptoms described in the history of present illness. He was evaluated in the context of the global COVID-19 pandemic, which necessitated consideration that the patient might be at risk for infection with the SARS-CoV-2 virus that causes COVID-19. Institutional protocols and algorithms that pertain to the evaluation of patients at risk for COVID-19 are in a state of rapid change based on information released by regulatory bodies including the CDC and federal and state organizations. These policies and algorithms were followed during the patient's care in the ED.       ____________________________________________   FINAL CLINICAL IMPRESSION(S) / ED DIAGNOSES  Final diagnoses:  Encounter for medication refill  Diabetes mellitus due to underlying condition with hyperosmolarity without coma, without long-term current use of insulin [facility])     ED Discharge Orders         Ordered    glipiZIDE (GLUCOTROL) 5 MG tablet  2 times daily     06/10/19 1128           Note:  This document was prepared using Dragon voice recognition software and may include unintentional dictation errors.    Sable Feil, PA-C 06/10/19 1136    Nena Polio, MD 06/10/19 [phone]

## 2019-06-10 NOTE — Discharge Instructions (Signed)
Advised to [facility].  Emergency room cannot be used for routine refills medication.

## 2019-06-10 NOTE — ED Notes (Signed)
Says needs rx for glipizide for diabetes.  He did make appt with a doctor, but they had trouble with his medicaid card.

## 2019-07-07 ENCOUNTER — Telehealth: Payer: Self-pay | Admitting: General Practice

## 2019-07-07 NOTE — Telephone Encounter (Signed)
Individual has been contacted 3+ times regarding ED referral and has been given information regarding the clinic. No further attempts to contact individual will be made. 

## 2019-07-08 ENCOUNTER — Telehealth: Payer: Self-pay | Admitting: General Practice

## 2019-07-08 NOTE — Telephone Encounter (Signed)
Individual has been contacted 3+ times regarding the ED referral and has been given information regarding the clinic. No further attempts to contact the individual will be made. °

## 2019-07-21 ENCOUNTER — Telehealth: Payer: Self-pay | Admitting: General Practice

## 2019-07-21 NOTE — Telephone Encounter (Signed)
Individual has been contacted 3+ times regarding ED referral. No further attempts to contact individual will be made. 

## 2019-08-17 ENCOUNTER — Emergency Department
Admission: EM | Admit: 2019-08-17 | Discharge: 2019-08-17 | Disposition: A | Discharge status: Home / Self Care | Payer: Self-pay | Attending: Emergency Medicine | Admitting: Emergency Medicine

## 2019-08-17 ENCOUNTER — Other Ambulatory Visit: Payer: Self-pay

## 2019-08-17 DIAGNOSIS — Z760 Encounter for issue of repeat prescription: Secondary | ICD-10-CM | POA: Insufficient documentation

## 2019-08-17 DIAGNOSIS — F17210 Nicotine dependence, cigarettes, uncomplicated: Secondary | ICD-10-CM | POA: Insufficient documentation

## 2019-08-17 DIAGNOSIS — E1165 Type 2 diabetes mellitus with hyperglycemia: Secondary | ICD-10-CM | POA: Insufficient documentation

## 2019-08-17 DIAGNOSIS — R739 Hyperglycemia, unspecified: Secondary | ICD-10-CM

## 2019-08-17 LAB — GLUCOSE, CAPILLARY: Glucose-Capillary: 303 mg/dL — ABNORMAL HIGH (ref 70–99)

## 2019-08-17 MED ORDER — GLIPIZIDE 5 MG PO TABS: 5.0000 mg | ORAL_TABLET | Freq: Two times a day (BID) | ORAL | 0 refills | Status: DC

## 2019-08-17 MED ORDER — GLIPIZIDE 5 MG PO TABS
5.0000 mg | ORAL_TABLET | Freq: Once | ORAL | Status: AC
  Administered 2019-08-17: 5 mg via ORAL
  Filled 2019-08-17: qty 1

## 2019-08-17 NOTE — ED Provider Notes (Signed)
[facility] Emergency Department Provider Note  ____________________________________________  Time seen: Approximately 5:24 PM  I have reviewed the triage vital signs and the nursing notes.   HISTORY  Chief Complaint Hyperglycemia    HPI Barry Jennings is a 34 y.o. male that presents to the emergency department for medication refill.  Patient takes glipizide twice daily for his diabetes. He checks his blood sugar every morning.  While on the glipizide, his blood sugar ran between 180 and 200.  Two days after running out of the medications, his blood sugar started running between 270 and 300.  Patient denies any symptoms currently.  Patient states that he has been monitoring his diet and exercising and trying to control his blood sugar using these methods since he ran out of his medication.  He notices that his blood sugars are higher on days that he does not play basketball.  He also drinks a lot of water and sugar free lemonade.  He has been routinely coming to the emergency department for this medication, as he has been having trouble getting established with primary care.  [facility] but was told that they would not see him since he thought he had Medicaid.  He called Medicaid office and sounds like his Medicaid was not approved.    Past Medical History:  Diagnosis Date   Diabetes mellitus without complication (HCC)     There are no problems to display for this patient.   History reviewed. No pertinent surgical history.  Prior to Admission medications   Medication Sig Start Date End Date Taking? Authorizing Provider  glipiZIDE (GLUCOTROL) 5 MG tablet Take 1 tablet (5 mg total) by mouth 2 (two) times daily. 08/17/19 10/16/19  Enid Derry, PA-C    Allergies Patient has no known allergies.  History reviewed. No pertinent family history.  Social History Social History   Tobacco Use   Smoking status: Current Every Day Smoker     Packs/day: 1.00    Types: Cigarettes   Smokeless tobacco: Never Used  Substance Use Topics   Alcohol use: Not Currently   Drug use: No     Review of Systems  Constitutional: No fever/chills Cardiovascular: No chest pain. Respiratory: No SOB. Gastrointestinal: No abdominal pain.  No nausea, no vomiting.  Musculoskeletal: Negative for musculoskeletal pain. Skin: Negative for rash, abrasions, lacerations, ecchymosis. Neurological: Negative for headaches, numbness or tingling   ____________________________________________   PHYSICAL EXAM:  VITAL SIGNS: ED Triage Vitals  Enc Vitals Group     BP 08/17/19 1636 (!) 131/94     Pulse Rate 08/17/19 1636 84     Resp 08/17/19 1636 18     Temp 08/17/19 1636 98.8 F (37.1 C)     Temp Source 08/17/19 1636 Oral     SpO2 08/17/19 1636 98 %     Weight 08/17/19 1637 186 lb (84.4 kg)     Height --      Head Circumference --      Peak Flow --      Pain Score 08/17/19 1637 0     Pain Loc --      Pain Edu? --      Excl. in GC? --      Constitutional: Alert and oriented. Well appearing and in no acute distress. Eyes: Conjunctivae are normal. PERRL. EOMI. Head: Atraumatic. ENT:      Ears:      Nose: No congestion/rhinnorhea.      Mouth/Throat: Mucous  membranes are moist.  Neck: No stridor.  Cardiovascular: Normal rate, regular rhythm.  Good peripheral circulation. Respiratory: Normal respiratory effort without tachypnea or retractions. Lungs CTAB. Good air entry to the bases with no decreased or absent breath sounds. Gastrointestinal: Bowel sounds 4 quadrants. Soft and nontender to palpation. No guarding or rigidity. No palpable masses. No distention.  Musculoskeletal: Full range of motion to all extremities. No gross deformities appreciated. Neurologic:  Normal speech and language. No gross focal neurologic deficits are appreciated.  Skin:  Skin is warm, dry and intact. No rash noted. Psychiatric: Mood and affect are normal.  Speech and behavior are normal. Patient exhibits appropriate insight and judgement.   ____________________________________________   LABS (all labs ordered are listed, but only abnormal results are displayed)  Labs Reviewed  GLUCOSE, CAPILLARY - Abnormal; Notable for the following components:      Result Value   Glucose-Capillary 303 (*)    All other components within normal limits   ____________________________________________  EKG   ____________________________________________  RADIOLOGY   No results found.  ____________________________________________    PROCEDURES  Procedure(s) performed:    Procedures    Medications  glipiZIDE (GLUCOTROL) tablet 5 mg (5 mg Oral Given 08/17/19 1812)     ____________________________________________   INITIAL IMPRESSION / ASSESSMENT AND PLAN / ED COURSE  Pertinent labs & imaging results that were available during my care of the patient were reviewed by me and considered in my medical decision making (see chart for details).  Review of the [place] CSRS was performed in accordance of the [facility] prior to dispensing any controlled drugs.   Patient's diagnosis is consistent with diabetes and medication refill.  Vital signs and exam are reassuring.  Patient is asymptomatic today.  He was encouraged to [facility] to [facility].  Patient will be discharged home with prescriptions for glipizide. Patient is to follow up with primary care as directed. Patient is given ED precautions to return to the ED for any worsening or new symptoms.   Barry Jennings was evaluated in Emergency Department on 08/17/2019 for the symptoms described in the history of present illness. He was evaluated in the context of the global COVID-19 pandemic, which necessitated consideration that the patient might be at risk for infection with the SARS-CoV-2 virus that causes COVID-19. Institutional protocols and algorithms that pertain to the evaluation  of patients at risk for COVID-19 are in a state of rapid change based on information released by regulatory bodies including the CDC and federal and state organizations. These policies and algorithms were followed during the patient's care in the ED.  ____________________________________________  FINAL CLINICAL IMPRESSION(S) / ED DIAGNOSES  Final diagnoses:  Hyperglycemia  Medication refill      NEW MEDICATIONS STARTED DURING THIS VISIT:  ED Discharge Orders         Ordered    glipiZIDE (GLUCOTROL) 5 MG tablet  2 times daily     Discontinue  Reprint     08/17/19 1754              This chart was dictated using voice recognition software/Dragon. Despite best efforts to proofread, errors can occur which can change the meaning. Any change was purely unintentional.    Enid Derry, PA-C 08/17/19 2229    Minna Antis, MD 08/17/19 2255

## 2019-08-17 NOTE — ED Notes (Signed)
See triage note  Presents with his BS being elevated  States he used to take meds but states he has been able to control his BS until recently

## 2019-08-17 NOTE — ED Triage Notes (Addendum)
Pt arrives via POV from home for reports of hyperglycemia. Pt reports his BG has been "up and down" recently. Pt reports he was taking glipizide but ran out a month ago and has not had this filled. Pt ambulatory from lobby with steady gait, NAD, skin warm and dry. Pt reports BG 277 before coming to ER.

## 2019-10-20 ENCOUNTER — Other Ambulatory Visit: Payer: Self-pay

## 2019-10-20 ENCOUNTER — Emergency Department
Admission: EM | Admit: 2019-10-20 | Discharge: 2019-10-20 | Disposition: A | Discharge status: Home / Self Care | Payer: Self-pay | Attending: Emergency Medicine | Admitting: Emergency Medicine

## 2019-10-20 DIAGNOSIS — Z7984 Long term (current) use of oral hypoglycemic drugs: Secondary | ICD-10-CM | POA: Insufficient documentation

## 2019-10-20 DIAGNOSIS — F17210 Nicotine dependence, cigarettes, uncomplicated: Secondary | ICD-10-CM | POA: Insufficient documentation

## 2019-10-20 DIAGNOSIS — E1165 Type 2 diabetes mellitus with hyperglycemia: Secondary | ICD-10-CM | POA: Insufficient documentation

## 2019-10-20 DIAGNOSIS — E0865 Diabetes mellitus due to underlying condition with hyperglycemia: Secondary | ICD-10-CM

## 2019-10-20 LAB — URINALYSIS, COMPLETE (UACMP) WITH MICROSCOPIC
Bacteria, UA: NONE SEEN
Bilirubin Urine: NEGATIVE
Glucose, UA: >=500 mg/dL — AB
Hgb urine dipstick: NEGATIVE
Ketones, ur: 20 mg/dL — AB
Leukocytes,Ua: NEGATIVE
Nitrite: NEGATIVE
Protein, ur: NEGATIVE mg/dL
Specific Gravity, Urine: 1.039 — ABNORMAL HIGH (ref 1.005–1.030)
Squamous Epithelial / HPF: NONE SEEN (ref 0–5)
pH: 6 (ref 5.0–8.0)

## 2019-10-20 LAB — BASIC METABOLIC PANEL
Anion gap: 12 (ref 5–15)
BUN: 10 mg/dL (ref 6–20)
CO2: 22 mmol/L (ref 22–32)
Calcium: 9.2 mg/dL (ref 8.9–10.3)
Chloride: 103 mmol/L (ref 98–111)
Creatinine, Ser: 0.84 mg/dL (ref 0.61–1.24)
GFR, Estimated: >60 mL/min (ref >60)
Glucose, Bld: 460 mg/dL — ABNORMAL HIGH (ref 70–99)
Potassium: 3.8 mmol/L (ref 3.5–5.1)
Sodium: 137 mmol/L (ref 135–145)

## 2019-10-20 LAB — CBC
HCT: 44 % (ref 39.0–52.0)
Hemoglobin: 15.4 g/dL (ref 13.0–17.0)
MCH: 28.5 pg (ref 26.0–34.0)
MCHC: 35 g/dL (ref 30.0–36.0)
MCV: 81.3 fL (ref 80.0–100.0)
Platelets: 201 10*3/uL (ref 150–400)
RBC: 5.41 MIL/uL (ref 4.22–5.81)
RDW: 12.7 % (ref 11.5–15.5)
WBC: 5.9 10*3/uL (ref 4.0–10.5)
nRBC: 0 % (ref 0.0–0.2)

## 2019-10-20 LAB — GLUCOSE, CAPILLARY
Glucose-Capillary: 355 mg/dL — ABNORMAL HIGH (ref 70–99)
Glucose-Capillary: 227 mg/dL — ABNORMAL HIGH (ref 70–99)

## 2019-10-20 MED ORDER — SODIUM CHLORIDE 0.9 % IV BOLUS
1000.0000 mL | Freq: Once | INTRAVENOUS | Status: AC
  Administered 2019-10-20: 1000 mL via INTRAVENOUS

## 2019-10-20 MED ORDER — INSULIN ASPART 100 UNIT/ML ~~LOC~~ SOLN
5.0000 [IU] | Freq: Once | SUBCUTANEOUS | Status: AC
  Administered 2019-10-20: 5 [IU] via INTRAVENOUS
  Filled 2019-10-20: qty 1

## 2019-10-20 MED ORDER — GLIPIZIDE 5 MG PO TABS: 5.0000 mg | ORAL_TABLET | Freq: Two times a day (BID) | ORAL | 0 refills | Status: DC

## 2019-10-20 MED ORDER — GLIPIZIDE 5 MG PO TABS
5.0000 mg | ORAL_TABLET | ORAL | Status: AC
  Administered 2019-10-20: 5 mg via ORAL
  Filled 2019-10-20: qty 1

## 2019-10-20 MED ORDER — GLIPIZIDE 5 MG PO TABS: 5.0000 mg | ORAL_TABLET | Freq: Two times a day (BID) | ORAL | 1 refills | Status: DC

## 2019-10-20 NOTE — ED Provider Notes (Signed)
[facility] Emergency Department Provider Note   ____________________________________________   First MD Initiated Contact with Patient 10/20/19 1159     (approximate)  I have reviewed the triage vital signs and the nursing notes.   HISTORY  Chief Complaint Hyperglycemia    HPI Barry Jennings is a 34 y.o. male history diabetes  Patient reports that he is noticed over the last several weeks has been urinating frequently a lot at night, thirsty.  She had symptoms like this before and his blood sugar is high.  He ran out of his diabetes medicine about a month ago.  He is not had an opportunity to follow-up with a primary doctor as he reports social services is working to clarify his insurance.  No fevers or chills no nausea vomiting.  Still eating and drinking.  Reports drinking lots of water.  Tries to watch his diet, but reports he still having issues with his diabetes and blood sugars  Takes glipizide but has run out   Past Medical History:  Diagnosis Date  . Diabetes mellitus without complication (HCC)     There are no problems to display for this patient.   History reviewed. No pertinent surgical history.  Prior to Admission medications   Medication Sig Start Date End Date Taking? Authorizing Provider  glipiZIDE (GLUCOTROL) 5 MG tablet Take 1 tablet (5 mg total) by mouth 2 (two) times daily. 10/20/19 12/19/19  Sharyn Creamer, MD    Allergies Patient has no known allergies.  No family history on file.  Social History Social History   Tobacco Use  . Smoking status: Current Every Day Smoker    Packs/day: 1.00    Types: Cigarettes  . Smokeless tobacco: Never Used  Substance Use Topics  . Alcohol use: Not Currently  . Drug use: No    Review of Systems Constitutional: No fever/chills Eyes: No visual changes. ENT: No sore throat.  Frequently thirsty Cardiovascular: Denies chest pain. Respiratory: Denies shortness of  breath. Gastrointestinal: No abdominal pain.   Genitourinary: Negative for dysuria.  Urinating frequently. Musculoskeletal: Negative for back pain. Skin: Negative for rash. Neurological: Negative for headaches, areas of focal weakness or numbness.    ____________________________________________   PHYSICAL EXAM:  VITAL SIGNS: ED Triage Vitals  Enc Vitals Group     BP 10/20/19 1030 120/78     Pulse Rate 10/20/19 1030 84     Resp 10/20/19 1030 17     Temp 10/20/19 1030 98.6 F (37 C)     Temp Source 10/20/19 1030 Oral     SpO2 10/20/19 1030 99 %     Weight 10/20/19 1032 186 lb (84.4 kg)     Height 10/20/19 1032 5\' 6"  (1.676 m)     Head Circumference --      Peak Flow --      Pain Score 10/20/19 1032 0     Pain Loc --      Pain Edu? --      Excl. in GC? --     Constitutional: Alert and oriented. Well appearing and in no acute distress. Eyes: Conjunctivae are normal. Head: Atraumatic. Nose: No congestion/rhinnorhea. Mouth/Throat: Mucous membranes are moist. Neck: No stridor.  Cardiovascular: Normal rate, regular rhythm. Grossly normal heart sounds.  Good peripheral circulation. Respiratory: Normal respiratory effort.  No retractions. Lungs CTAB. Gastrointestinal: Soft and nontender. No distention. Musculoskeletal: No lower extremity tenderness nor edema. Neurologic:  Normal speech and language. No gross focal neurologic deficits are appreciated.  Skin:  Skin is warm, dry and intact. No rash noted. Psychiatric: Mood and affect are normal. Speech and behavior are normal.  ____________________________________________   LABS (all labs ordered are listed, but only abnormal results are displayed)  Labs Reviewed  BASIC METABOLIC PANEL - Abnormal; Notable for the following components:      Result Value   Glucose, Bld 460 (*)    All other components within normal limits  URINALYSIS, COMPLETE (UACMP) WITH MICROSCOPIC - Abnormal; Notable for the following components:    Color, Urine STRAW (*)    APPearance CLEAR (*)    Specific Gravity, Urine 1.039 (*)    Glucose, UA >=500 (*)    Ketones, ur 20 (*)    All other components within normal limits  GLUCOSE, CAPILLARY - Abnormal; Notable for the following components:   Glucose-Capillary 355 (*)    All other components within normal limits  GLUCOSE, CAPILLARY - Abnormal; Notable for the following components:   Glucose-Capillary 227 (*)    All other components within normal limits  CBC  CBG MONITORING, ED  CBG MONITORING, ED   ____________________________________________  EKG   ____________________________________________  RADIOLOGY   ____________________________________________   PROCEDURES  Procedure(s) performed: None  Procedures  Critical Care performed: No  ____________________________________________   INITIAL IMPRESSION / ASSESSMENT AND PLAN / ED COURSE  Pertinent labs & imaging results that were available during my care of the patient were reviewed by me and considered in my medical decision making (see chart for details).   Reassuring clinical exam.  Nontoxic stable vital signs.  Reports dysuria polyuria.  Ran out of his diabetes medicine about a month ago.  Clinical history and exam seem consistent with hyperglycemia secondary to diabetes.  I do not see evidence of acute DKA by clinical history or laboratory analysis.  He does have some ketones in his urine, but his CO2 is normal, his anion gap is normal.  I will hydrate, provide his glipizide, also give small dose of IV insulin for glucose control.  Discussed extensively with the patient need to find follow-up option, he identified a clinic [facility] [place] [facility] that he believes that he can follow-up with but needs to complete some paperwork for.  We will also give prescription refill  Discussed careful return precautions which patient agreement with  No associated neurologic cardiac or pulmonary symptoms.  Reassuring exam     ----------------------------------------- 2:33 PM on 10/20/2019 -----------------------------------------  Patient's glucose is improved. Resting comfortably eating and drinking without distress. Comfortable plan for discharge, have discussed with the patient updated his prescription to include a refill as reports he cannot get into [facility] for about 2 months time.  Return precautions and treatment recommendations and follow-up discussed with the patient who is agreeable with the plan.   ____________________________________________   FINAL CLINICAL IMPRESSION(S) / ED DIAGNOSES  Final diagnoses:  Diabetes mellitus due to underlying condition with hyperglycemia, without long-term current use of insulin (HCC)        Note:  This document was prepared using Dragon voice recognition software and may include unintentional dictation errors       Sharyn Creamer, MD 10/20/19 1434

## 2019-10-20 NOTE — ED Notes (Signed)
Awaiting disposition from MD

## 2019-10-20 NOTE — ED Triage Notes (Signed)
Pt states " I think my sugar is elevated" states he has been out of his meds for 2 months and lost his meter. States he has had increased urinary frequency for the past 3 days.

## 2019-10-21 LAB — GLUCOSE, CAPILLARY: Glucose-Capillary: 407 mg/dL — ABNORMAL HIGH (ref 70–99)

## 2020-02-10 ENCOUNTER — Other Ambulatory Visit: Payer: Self-pay

## 2020-02-10 ENCOUNTER — Emergency Department
Admission: EM | Admit: 2020-02-10 | Discharge: 2020-02-10 | Disposition: A | Discharge status: Home / Self Care | Payer: Self-pay | Attending: Emergency Medicine | Admitting: Emergency Medicine

## 2020-02-10 DIAGNOSIS — F17210 Nicotine dependence, cigarettes, uncomplicated: Secondary | ICD-10-CM | POA: Insufficient documentation

## 2020-02-10 DIAGNOSIS — E1165 Type 2 diabetes mellitus with hyperglycemia: Secondary | ICD-10-CM | POA: Insufficient documentation

## 2020-02-10 DIAGNOSIS — R739 Hyperglycemia, unspecified: Secondary | ICD-10-CM

## 2020-02-10 LAB — CBC
HCT: 45.3 % (ref 39.0–52.0)
Hemoglobin: 15.7 g/dL (ref 13.0–17.0)
MCH: 28.4 pg (ref 26.0–34.0)
MCHC: 34.7 g/dL (ref 30.0–36.0)
MCV: 81.9 fL (ref 80.0–100.0)
Platelets: 232 10*3/uL (ref 150–400)
RBC: 5.53 MIL/uL (ref 4.22–5.81)
RDW: 12.8 % (ref 11.5–15.5)
WBC: 5.4 10*3/uL (ref 4.0–10.5)
nRBC: 0 % (ref 0.0–0.2)

## 2020-02-10 LAB — URINALYSIS, COMPLETE (UACMP) WITH MICROSCOPIC
Bacteria, UA: NONE SEEN
Bilirubin Urine: NEGATIVE
Glucose, UA: >=500 mg/dL — AB
Hgb urine dipstick: NEGATIVE
Ketones, ur: 20 mg/dL — AB
Leukocytes,Ua: NEGATIVE
Nitrite: NEGATIVE
Protein, ur: NEGATIVE mg/dL
Specific Gravity, Urine: 1.032 — ABNORMAL HIGH (ref 1.005–1.030)
Squamous Epithelial / HPF: NONE SEEN (ref 0–5)
pH: 7 (ref 5.0–8.0)

## 2020-02-10 LAB — BASIC METABOLIC PANEL
Anion gap: 12 (ref 5–15)
BUN: 12 mg/dL (ref 6–20)
CO2: 22 mmol/L (ref 22–32)
Calcium: 9.5 mg/dL (ref 8.9–10.3)
Chloride: 103 mmol/L (ref 98–111)
Creatinine, Ser: 0.84 mg/dL (ref 0.61–1.24)
GFR, Estimated: >60 mL/min (ref >60)
Glucose, Bld: 334 mg/dL — ABNORMAL HIGH (ref 70–99)
Potassium: 3.8 mmol/L (ref 3.5–5.1)
Sodium: 137 mmol/L (ref 135–145)

## 2020-02-10 LAB — CBG MONITORING, ED
Glucose-Capillary: 313 mg/dL — ABNORMAL HIGH (ref 70–99)
Glucose-Capillary: 226 mg/dL — ABNORMAL HIGH (ref 70–99)
Glucose-Capillary: 147 mg/dL — ABNORMAL HIGH (ref 70–99)

## 2020-02-10 MED ORDER — GLIPIZIDE 5 MG PO TABS: 5.0000 mg | ORAL_TABLET | Freq: Two times a day (BID) | ORAL | 5 refills | Status: DC

## 2020-02-10 MED ORDER — INSULIN ASPART 100 UNIT/ML ~~LOC~~ SOLN
6.0000 [IU] | Freq: Once | SUBCUTANEOUS | Status: AC
  Administered 2020-02-10: 6 [IU] via INTRAVENOUS
  Filled 2020-02-10: qty 1

## 2020-02-10 MED ORDER — SODIUM CHLORIDE 0.9 % IV BOLUS
1000.0000 mL | Freq: Once | INTRAVENOUS | Status: AC
  Administered 2020-02-10: 1000 mL via INTRAVENOUS

## 2020-02-10 NOTE — ED Provider Notes (Signed)
[facility] Emergency Department Provider Note  Time seen: 3:00 PM  I have reviewed the triage vital signs and the nursing notes.   HISTORY  Chief Complaint Hyperglycemia   HPI Barry Jennings is a 35 y.o. male with a past medical history of diabetes who presents to the emergency department for hyperglycemia.  According to the patient for the past few weeks he has been feeling dehydrated his blood sugars have been in the 3 and 400 range at home at times.  States he is drinking lots of water, but continues to have to urinate overnight and states his urine is often dark.  Patient states he ran out of his glipizide 2 months ago, currently working to get him with his PCP.  Patient still able to check his fingersticks at home.   Past Medical History:  Diagnosis Date  . Diabetes mellitus without complication (HCC)     There are no problems to display for this patient.   No past surgical history on file.  Prior to Admission medications   Medication Sig Start Date End Date Taking? Authorizing Provider  glipiZIDE (GLUCOTROL) 5 MG tablet Take 1 tablet (5 mg total) by mouth 2 (two) times daily. 10/20/19 12/19/19  Sharyn Creamer, MD    No Known Allergies  No family history on file.  Social History Social History   Tobacco Use  . Smoking status: Current Every Day Smoker    Packs/day: 1.00    Types: Cigarettes  . Smokeless tobacco: Never Used  Substance Use Topics  . Alcohol use: Not Currently  . Drug use: No    Review of Systems Constitutional: Negative for fever Cardiovascular: Negative for chest pain. Respiratory: Negative for shortness of breath. Gastrointestinal: Negative for abdominal pain.  No nausea vomiting Genitourinary: Urinary frequency without dysuria Musculoskeletal: Negative for musculoskeletal complaints Neurological: Negative for headache All other ROS negative  ____________________________________________   PHYSICAL EXAM:  VITAL  SIGNS: ED Triage Vitals  Enc Vitals Group     BP 02/10/20 1259 (!) 150/89     Pulse Rate 02/10/20 1259 99     Resp 02/10/20 1259 20     Temp 02/10/20 1259 98.4 F (36.9 C)     Temp Source 02/10/20 1259 Oral     SpO2 02/10/20 1259 99 %     Weight 02/10/20 1303 186 lb (84.4 kg)     Height 02/10/20 1303 5\' 6"  (1.676 m)     Head Circumference --      Peak Flow --      Pain Score 02/10/20 1303 0     Pain Loc --      Pain Edu? --      Excl. in GC? --    Constitutional: Alert and oriented. Well appearing and in no distress. Eyes: Normal exam ENT      Head: Normocephalic and atraumatic.      Mouth/Throat: Mucous membranes are moist. Cardiovascular: Normal rate, regular rhythm.  Respiratory: Normal respiratory effort without tachypnea nor retractions. Breath sounds are clear  Gastrointestinal: Soft and nontender. No distention.  Musculoskeletal: Nontender with normal range of motion in all extremities. Neurologic:  Normal speech and language. No gross focal neurologic deficits are appreciated. Skin:  Skin is warm, dry and intact.  Psychiatric: Mood and affect are normal. Speech and behavior are normal.   ____________________________________________   INITIAL IMPRESSION / ASSESSMENT AND PLAN / ED COURSE  Pertinent labs & imaging results that were available during my care of the  patient were reviewed by me and considered in my medical decision making (see chart for details).   Patient presents emergency department for frequent urination and elevated blood glucose.  Patient is taking 5 mg glipizide tablets but ran out 2 months ago.  Has been checking his blood sugar and has been ranging in the 3-400 range at times.  Patient has been drinking plenty water but states he cannot get it to stay down.  Patient ran out of his medication 2 months ago is currently try to get with his PCP but is waiting his Medicare approval.  Patient's lab work is overall reassuring with a elevated blood glucose  334 and chemistry.  We will dose IV insulin as well as IV fluids and reassess.  Otherwise the patient appears well with reassuring physical exam.  I anticipate likely discharge home, we will refill the patient with 6 months of his glipizide medication while he is attempting to get in with a PCP.  Patient agreeable to plan of care.  Discussed dietary recommendations as well.  Patient is feeling better.  Blood glucose down 147.  Will discharge patient home with glipizide and PCP follow-up.  Patient agreeable.  Barry Jennings was evaluated in Emergency Department on 02/10/2020 for the symptoms described in the history of present illness. He was evaluated in the context of the global COVID-19 pandemic, which necessitated consideration that the patient might be at risk for infection with the SARS-CoV-2 virus that causes COVID-19. Institutional protocols and algorithms that pertain to the evaluation of patients at risk for COVID-19 are in a state of rapid change based on information released by regulatory bodies including the CDC and federal and state organizations. These policies and algorithms were followed during the patient's care in the ED.  ____________________________________________   FINAL CLINICAL IMPRESSION(S) / ED DIAGNOSES  Hyperglycemia   Minna Antis, MD 02/10/20 1646

## 2020-02-10 NOTE — ED Triage Notes (Signed)
Pt to ED POV chief complaint of hyperglycemia for 3 days with increased urination and thirst. CBG 313. States ran out of meds 2 months ago

## 2020-08-28 ENCOUNTER — Emergency Department
Admission: EM | Admit: 2020-08-28 | Discharge: 2020-08-28 | Disposition: A | Discharge status: Home / Self Care | Payer: Self-pay | Attending: Emergency Medicine | Admitting: Emergency Medicine

## 2020-08-28 ENCOUNTER — Other Ambulatory Visit: Payer: Self-pay

## 2020-08-28 ENCOUNTER — Emergency Department: Payer: Self-pay

## 2020-08-28 DIAGNOSIS — E119 Type 2 diabetes mellitus without complications: Secondary | ICD-10-CM | POA: Insufficient documentation

## 2020-08-28 DIAGNOSIS — N50819 Testicular pain, unspecified: Secondary | ICD-10-CM

## 2020-08-28 DIAGNOSIS — K0889 Other specified disorders of teeth and supporting structures: Secondary | ICD-10-CM | POA: Insufficient documentation

## 2020-08-28 DIAGNOSIS — F17210 Nicotine dependence, cigarettes, uncomplicated: Secondary | ICD-10-CM | POA: Insufficient documentation

## 2020-08-28 DIAGNOSIS — R369 Urethral discharge, unspecified: Secondary | ICD-10-CM | POA: Insufficient documentation

## 2020-08-28 DIAGNOSIS — R300 Dysuria: Secondary | ICD-10-CM

## 2020-08-28 LAB — CBC
HCT: 42.7 % (ref 39.0–52.0)
Hemoglobin: 14.8 g/dL (ref 13.0–17.0)
MCH: 28.4 pg (ref 26.0–34.0)
MCHC: 34.7 g/dL (ref 30.0–36.0)
MCV: 81.8 fL (ref 80.0–100.0)
Platelets: 269 10*3/uL (ref 150–400)
RBC: 5.22 MIL/uL (ref 4.22–5.81)
RDW: 12.3 % (ref 11.5–15.5)
WBC: 5.9 10*3/uL (ref 4.0–10.5)
nRBC: 0 % (ref 0.0–0.2)

## 2020-08-28 LAB — URINALYSIS, COMPLETE (UACMP) WITH MICROSCOPIC
Bilirubin Urine: NEGATIVE
Glucose, UA: >=500 mg/dL — AB
Hgb urine dipstick: NEGATIVE
Ketones, ur: 5 mg/dL — AB
Nitrite: NEGATIVE
Protein, ur: 30 mg/dL — AB
Specific Gravity, Urine: 1.028 (ref 1.005–1.030)
Squamous Epithelial / HPF: NONE SEEN (ref 0–5)
WBC, UA: >50 WBC/hpf — ABNORMAL HIGH (ref 0–5)
pH: 5 (ref 5.0–8.0)

## 2020-08-28 LAB — CHLAMYDIA/NGC RT PCR (ARMC ONLY)
Chlamydia Tr: DETECTED — AB
N gonorrhoeae: DETECTED — AB

## 2020-08-28 LAB — BASIC METABOLIC PANEL
Anion gap: 8 (ref 5–15)
BUN: 14 mg/dL (ref 6–20)
CO2: 25 mmol/L (ref 22–32)
Calcium: 8.8 mg/dL — ABNORMAL LOW (ref 8.9–10.3)
Chloride: 103 mmol/L (ref 98–111)
Creatinine, Ser: 0.81 mg/dL (ref 0.61–1.24)
GFR, Estimated: >60 mL/min (ref >60)
Glucose, Bld: 283 mg/dL — ABNORMAL HIGH (ref 70–99)
Potassium: 3.5 mmol/L (ref 3.5–5.1)
Sodium: 136 mmol/L (ref 135–145)

## 2020-08-28 MED ORDER — CEFTRIAXONE SODIUM 250 MG IJ SOLR
250.0000 mg | Freq: Once | INTRAMUSCULAR | Status: AC
  Administered 2020-08-28: 250 mg via INTRAMUSCULAR
  Filled 2020-08-28: qty 250

## 2020-08-28 MED ORDER — METRONIDAZOLE 500 MG PO TABS
2000.0000 mg | ORAL_TABLET | Freq: Once | ORAL | Status: AC
  Administered 2020-08-28: 2000 mg via ORAL
  Filled 2020-08-28: qty 4

## 2020-08-28 MED ORDER — LIDOCAINE HCL (PF) 1 % IJ SOLN: 5.0000 mL | Freq: Once | INTRAMUSCULAR | Status: DC

## 2020-08-28 MED ORDER — AZITHROMYCIN 500 MG PO TABS
1000.0000 mg | ORAL_TABLET | Freq: Once | ORAL | Status: AC
  Administered 2020-08-28: 1000 mg via ORAL
  Filled 2020-08-28: qty 2

## 2020-08-28 NOTE — ED Triage Notes (Signed)
Pt comes pov with genital pain and swelling for 2 days. Concerned for kidney stone. Also hurts with urination.

## 2020-08-28 NOTE — ED Provider Notes (Signed)
[facility] Emergency Department Provider Note ____________________________________________  Time seen: 1329  I have reviewed the triage vital signs and the nursing notes.  HISTORY  Chief Complaint  genital pain   HPI Barry Jennings is a 35 y.o. male With medical history of diabetes, presents with complaints of dental pain and swelling for the last 2 days.  Patient reports dysuria and penile discharge, as well as concern for kidney stone.  He denies any history of previous kidney stones.  Denies any fever, chills, or sweats.  Patient also denies any recent sexual contact.  Past Medical History:  Diagnosis Date   Diabetes mellitus without complication (HCC)     There are no problems to display for this patient.   History reviewed. No pertinent surgical history.  Prior to Admission medications   Medication Sig Start Date End Date Taking? Authorizing Provider  glipiZIDE (GLUCOTROL) 5 MG tablet Take 1 tablet (5 mg total) by mouth 2 (two) times daily before a meal. 02/10/20 08/08/20  Minna Antis, MD    Allergies Patient has no known allergies.  History reviewed. No pertinent family history.  Social History Social History   Tobacco Use   Smoking status: Every Day    Packs/day: 1.00    Types: Cigarettes   Smokeless tobacco: Never  Substance Use Topics   Alcohol use: Not Currently   Drug use: No    Review of Systems  Constitutional: Negative for fever. Eyes: Negative for visual changes. ENT: Negative for sore throat. Cardiovascular: Negative for chest pain. Respiratory: Negative for shortness of breath. Gastrointestinal: Negative for abdominal pain, vomiting and diarrhea. Genitourinary: Positive for dysuria.  Reports genital swelling and pain. Musculoskeletal: Negative for back pain. Skin: Negative for rash. Neurological: Negative for headaches, focal weakness or numbness. ____________________________________________  PHYSICAL  EXAM:  VITAL SIGNS: ED Triage Vitals [08/28/20 1046]  Enc Vitals Group     BP (!) 147/93     Pulse Rate 87     Resp 18     Temp      Temp src      SpO2 98 %     Weight 185 lb 3 oz (84 kg)     Height 5\' 6"  (1.676 m)     Head Circumference      Peak Flow      Pain Score 8     Pain Loc      Pain Edu?      Excl. in GC?     Constitutional: Alert and oriented. Well appearing and in no distress. Head: Normocephalic and atraumatic. Eyes: Conjunctivae are normal. Normal extraocular movements Cardiovascular: Normal rate, regular rhythm. Normal distal pulses. Respiratory: Normal respiratory effort. No wheezes/rales/rhonchi. Gastrointestinal: Soft and nontender. No distention. GU: Normal external genitalia.  Circumscribed glands.  No active lesions appreciated.  Milky discharge noted at the ureteral meatus. Musculoskeletal: Nontender with normal range of motion in all extremities.  Neurologic:  Normal gait without ataxia. Normal speech and language. No gross focal neurologic deficits are appreciated. Skin:  Skin is warm, dry and intact. No rash noted. Psychiatric: Mood and affect are normal. Patient exhibits appropriate insight and judgment. ____________________________________________    {LABS (pertinent positives/negatives) Labs Reviewed  BASIC METABOLIC PANEL - Abnormal; Notable for the following components:      Result Value   Glucose, Bld 283 (*)    Calcium 8.8 (*)    All other components within normal limits  URINALYSIS, COMPLETE (UACMP) WITH MICROSCOPIC - Abnormal; Notable for the  following components:   Color, Urine YELLOW (*)    APPearance CLOUDY (*)    Glucose, UA >=500 (*)    Ketones, ur 5 (*)    Protein, ur 30 (*)    Leukocytes,Ua LARGE (*)    WBC, UA >50 (*)    Bacteria, UA FEW (*)    All other components within normal limits  CHLAMYDIA/NGC RT PCR ([facility] ONLY)            CBC   ____________________________________________  {EKG  ____________________________________________   RADIOLOGY Official radiology report(s): US SCROTUM W/DOPPLER  Result Date: 08/28/2020 CLINICAL DATA:  Scrotal pain and swelling for 2 days. EXAM: SCROTAL ULTRASOUND DOPPLER ULTRASOUND OF THE TESTICLES TECHNIQUE: Complete ultrasound examination of the testicles, epididymis, and other scrotal structures was performed. Color and spectral Doppler ultrasound were also utilized to evaluate blood flow to the testicles. COMPARISON:  None. FINDINGS: Right testicle Measurements: 4.4 x 2 x 3 cm.  No mass or microlithiasis visualized. Left testicle Measurements: 4.5 x 2.3 x 2.7 cm. No mass or microlithiasis visualized. Right epididymis:  Normal in size and appearance. Left epididymis:  Normal in size and appearance. Hydrocele:  None visualized. Varicocele:  LEFT-sided varicocele. Pulsed Doppler interrogation of both testes demonstrates normal low resistance arterial and venous waveforms bilaterally. IMPRESSION: 1. No acute findings. No evidence of testicular torsion or orchitis. No evidence of epididymitis. 2. Mild LEFT-sided varicocele. Electronically Signed   By: Bary Richard M.D.   On: 08/28/2020 14:30   ____________________________________________  PROCEDURES  Azithromycin 1 g p.o. Metronidazole 2000 mg p.o. Rocephin 250 mg IM  Procedures ____________________________________________   INITIAL IMPRESSION / ASSESSMENT AND PLAN / ED COURSE  As part of my medical decision making, I reviewed the following data within the electronic MEDICAL RECORD NUMBER Labs reviewed as noted and Notes from prior ED visits    DDX: gonorrhea, chlamydia, NGU, trich    Patient ED evaluation of dysuria and penile discharge, concerning for possible kidney stone.  Patient denies any hematuria, urinary retention, abdominal pain, flank pain.  Evaluate was complaints in the ED, found to have a UA that revealed moderate  leukocyturia.  GC urine culture is pending at this time.  Patient was treated empirically with azithromycin, Rocephin, and metronidazole.  He will await results of his STD testing.  Clinical picture may be consistent with an NGU, given the patient's port of no recent active sexual activity.  Barry Jennings was evaluated in Emergency Department on 08/28/2020 for the symptoms described in the history of present illness. He was evaluated in the context of the global COVID-19 pandemic, which necessitated consideration that the patient might be at risk for infection with the SARS-CoV-2 virus that causes COVID-19. Institutional protocols and algorithms that pertain to the evaluation of patients at risk for COVID-19 are in a state of rapid change based on information released by regulatory bodies including the CDC and federal and state organizations. These policies and algorithms were followed during the patient's care in the ED. ____________________________________________  FINAL CLINICAL IMPRESSION(S) / ED DIAGNOSES  Final diagnoses:  Testicle pain  Dysuria      Karmen Stabs, Charlesetta Ivory, PA-C 08/28/20 1621    Phineas Semen, MD 08/29/20 1501

## 2020-08-28 NOTE — ED Notes (Signed)
US at bedside

## 2020-08-28 NOTE — Discharge Instructions (Addendum)
You have been treated for all possible causes of urethritis.  You may follow your results on [facility] MyChart.  Follow-up with [facility] community health department for ongoing symptom management.

## 2020-08-29 ENCOUNTER — Telehealth: Payer: Self-pay | Admitting: Emergency Medicine

## 2020-08-29 NOTE — Telephone Encounter (Signed)
Called patient to inform of std results.  He was treated during visit.  No answer.  Left message.

## 2020-10-15 ENCOUNTER — Other Ambulatory Visit: Payer: Self-pay

## 2020-10-15 ENCOUNTER — Encounter: Payer: Self-pay | Admitting: Emergency Medicine

## 2020-10-15 ENCOUNTER — Emergency Department
Admission: EM | Admit: 2020-10-15 | Discharge: 2020-10-15 | Disposition: A | Discharge status: Home / Self Care | Payer: Self-pay | Attending: Student in an Organized Health Care Education/Training Program | Admitting: Student in an Organized Health Care Education/Training Program

## 2020-10-15 DIAGNOSIS — Z7984 Long term (current) use of oral hypoglycemic drugs: Secondary | ICD-10-CM | POA: Insufficient documentation

## 2020-10-15 DIAGNOSIS — F17210 Nicotine dependence, cigarettes, uncomplicated: Secondary | ICD-10-CM | POA: Insufficient documentation

## 2020-10-15 DIAGNOSIS — R350 Frequency of micturition: Secondary | ICD-10-CM | POA: Insufficient documentation

## 2020-10-15 DIAGNOSIS — E1165 Type 2 diabetes mellitus with hyperglycemia: Secondary | ICD-10-CM | POA: Insufficient documentation

## 2020-10-15 DIAGNOSIS — R739 Hyperglycemia, unspecified: Secondary | ICD-10-CM

## 2020-10-15 LAB — CBC
HCT: 45.5 % (ref 39.0–52.0)
Hemoglobin: 16.1 g/dL (ref 13.0–17.0)
MCH: 29 pg (ref 26.0–34.0)
MCHC: 35.4 g/dL (ref 30.0–36.0)
MCV: 82 fL (ref 80.0–100.0)
Platelets: 209 10*3/uL (ref 150–400)
RBC: 5.55 MIL/uL (ref 4.22–5.81)
RDW: 12.5 % (ref 11.5–15.5)
WBC: 4.1 10*3/uL (ref 4.0–10.5)
nRBC: 0 % (ref 0.0–0.2)

## 2020-10-15 LAB — BASIC METABOLIC PANEL
Anion gap: 10 (ref 5–15)
BUN: 16 mg/dL (ref 6–20)
CO2: 24 mmol/L (ref 22–32)
Calcium: 9.1 mg/dL (ref 8.9–10.3)
Chloride: 99 mmol/L (ref 98–111)
Creatinine, Ser: 0.74 mg/dL (ref 0.61–1.24)
GFR, Estimated: >60 mL/min (ref >60)
Glucose, Bld: 365 mg/dL — ABNORMAL HIGH (ref 70–99)
Potassium: 3.6 mmol/L (ref 3.5–5.1)
Sodium: 133 mmol/L — ABNORMAL LOW (ref 135–145)

## 2020-10-15 LAB — CBG MONITORING, ED
Glucose-Capillary: 360 mg/dL — ABNORMAL HIGH (ref 70–99)
Glucose-Capillary: 181 mg/dL — ABNORMAL HIGH (ref 70–99)

## 2020-10-15 MED ORDER — SODIUM CHLORIDE 0.9 % IV BOLUS
1000.0000 mL | Freq: Once | INTRAVENOUS | Status: AC
  Administered 2020-10-15: 1000 mL via INTRAVENOUS

## 2020-10-15 MED ORDER — INSULIN ASPART 100 UNIT/ML IJ SOLN
5.0000 [IU] | Freq: Once | INTRAMUSCULAR | Status: AC
  Administered 2020-10-15: 5 [IU] via SUBCUTANEOUS
  Filled 2020-10-15: qty 1

## 2020-10-15 MED ORDER — GLIPIZIDE 5 MG PO TABS: 5.0000 mg | ORAL_TABLET | Freq: Two times a day (BID) | ORAL | 5 refills | Status: AC

## 2020-10-15 NOTE — ED Triage Notes (Signed)
Pt reports his blood sugar has been running high. Pt states he has been out of his meds for awhile and has been trying to control it at home but it is now staying in the 300's or higher

## 2020-10-15 NOTE — ED Notes (Signed)
Called lab to add on labs

## 2020-10-15 NOTE — ED Notes (Signed)
Pt states he is ready to go home. Rechecked blood sugar 181. Pt states he is feeling much better.

## 2020-10-15 NOTE — ED Provider Notes (Signed)
[facility] Emergency Department Provider Note    Event Date/Time   First MD Initiated Contact with Patient 10/15/20 1430     (approximate)  I have reviewed the triage vital signs and the nursing notes.   HISTORY  Chief Complaint Hyperglycemia    HPI EFREN KROSS is a 35 y.o. male the below listed past medical history presents to the ER for evaluation of elevated blood glucose for the past several weeks.  Ran out of his 5 mg glipizide about a month ago does not have a PCP.  States he will from time to time feel fatigued and has noted increased urinary frequency but otherwise feels well.  No history of DKA requiring specialization for diabetic complications.  Denies any other complaints.  Past Medical History:  Diagnosis Date   Diabetes mellitus without complication (HCC)    No family history on file. History reviewed. No pertinent surgical history. There are no problems to display for this patient.     Prior to Admission medications   Medication Sig Start Date End Date Taking? Authorizing Provider  glipiZIDE (GLUCOTROL) 5 MG tablet Take 1 tablet (5 mg total) by mouth 2 (two) times daily before a meal. 10/15/20 04/13/21  Willy Eddy, MD    Allergies Patient has no known allergies.    Social History Social History   Tobacco Use   Smoking status: Every Day    Packs/day: 1.00    Types: Cigarettes   Smokeless tobacco: Never  Substance Use Topics   Alcohol use: Not Currently   Drug use: No    Review of Systems Patient denies headaches, rhinorrhea, blurry vision, numbness, shortness of breath, chest pain, edema, cough, abdominal pain, nausea, vomiting, diarrhea, dysuria, fevers, rashes or hallucinations unless otherwise stated above in HPI. ____________________________________________   PHYSICAL EXAM:  VITAL SIGNS: Vitals:   10/15/20 1136 10/15/20 1505  BP: 130/82 108/79  Pulse: 62 60  Resp: 20 20  Temp: 98.5 F (36.9 C)  98.5 F (36.9 C)  SpO2: 99% 99%    Constitutional: Alert and oriented.  Eyes: Conjunctivae are normal.  Head: Atraumatic. Nose: No congestion/rhinnorhea. Mouth/Throat: Mucous membranes are moist.   Neck: No stridor. Painless ROM.  Cardiovascular: Normal rate, regular rhythm. Grossly normal heart sounds.  Good peripheral circulation. Respiratory: Normal respiratory effort.  No retractions. Lungs CTAB. Gastrointestinal: Soft and nontender. No distention. No abdominal bruits. No CVA tenderness. Genitourinary:  Musculoskeletal: No lower extremity tenderness nor edema.  No joint effusions. Neurologic:  Normal speech and language. No gross focal neurologic deficits are appreciated. No facial droop Skin:  Skin is warm, dry and intact. No rash noted. Psychiatric: Mood and affect are normal. Speech and behavior are normal.  ____________________________________________   LABS (all labs ordered are listed, but only abnormal results are displayed)  Results for orders placed or performed during the hospital encounter of 10/15/20 (from the past 24 hour(s))  CBG monitoring, ED     Status: Abnormal   Collection Time: 10/15/20 11:42 AM  Result Value Ref Range   Glucose-Capillary 360 (H) 70 - 99 mg/dL  Basic metabolic panel     Status: Abnormal   Collection Time: 10/15/20 11:54 AM  Result Value Ref Range   Sodium 133 (L) 135 - 145 mmol/L   Potassium 3.6 3.5 - 5.1 mmol/L   Chloride 99 98 - 111 mmol/L   CO2 24 22 - 32 mmol/L   Glucose, Bld 365 (H) 70 - 99 mg/dL   BUN 16  6 - 20 mg/dL   Creatinine, Ser 0.25 0.61 - 1.24 mg/dL   Calcium 9.1 8.9 - 42.7 mg/dL   GFR, Estimated >06 >23 mL/min   Anion gap 10 5 - 15  CBG monitoring, ED     Status: Abnormal   Collection Time: 10/15/20  4:50 PM  Result Value Ref Range   Glucose-Capillary 181 (H) 70 - 99 mg/dL    ____________________________________________ ____________________________________________  RADIOLOGY   ____________________________________________   PROCEDURES  Procedure(s) performed:  Procedures    Critical Care performed: no ____________________________________________   INITIAL IMPRESSION / ASSESSMENT AND PLAN / ED COURSE  Pertinent labs & imaging results that were available during my care of the patient were reviewed by me and considered in my medical decision making (see chart for details).   DDX: hyperglycemia, dka, hhs, electrolyte abn, noncompliance  KELSEY EDMAN is a 35 y.o. who presents to the ED with presentation as described above.  Patient clinically very well-appearing afebrile hemodynamically stable with hyperglycemia.  Blood work ordered to evaluate for signs of DKA or HHS or acute metabolic abnormality.  Will give IV fluids as well as subcu insulin.  Blood work without evidence of DKA or HHS.  Blood sugar improved after insulin and IV fluids.  Patient requesting discharge.  Does appear stable and appropriate for outpatient follow-up.  We will refill his previously prescribed glipizide.     The patient was evaluated in Emergency Department today for the symptoms described in the history of present illness. He/she was evaluated in the context of the global COVID-19 pandemic, which necessitated consideration that the patient might be at risk for infection with the SARS-CoV-2 virus that causes COVID-19. Institutional protocols and algorithms that pertain to the evaluation of patients at risk for COVID-19 are in a state of rapid change based on information released by regulatory bodies including the CDC and federal and state organizations. These policies and algorithms were followed during the patient's care in the ED.  As part of my medical decision making, I reviewed the following data within the electronic MEDICAL RECORD NUMBER Nursing notes reviewed and  incorporated, Labs reviewed, notes from prior ED visits and  Controlled Substance Database   ____________________________________________   FINAL CLINICAL IMPRESSION(S) / ED DIAGNOSES  Final diagnoses:  Hyperglycemia      NEW MEDICATIONS STARTED DURING THIS VISIT:  Current Discharge Medication List       Note:  This document was prepared using Dragon voice recognition software and may include unintentional dictation errors.    Willy Eddy, MD 10/15/20 [phone]

## 2020-10-15 NOTE — ED Notes (Signed)
Dc instructions reviewed with patient no questions or concerns at this time. Will follow up with pcp. Will pick up medication from pharmacy today. Pt ambulated to lobby with steady gait. Denies need for wheelchair.

## 2020-11-29 ENCOUNTER — Emergency Department
Admission: EM | Admit: 2020-11-29 | Discharge: 2020-11-29 | Disposition: A | Discharge status: Home / Self Care | Payer: Self-pay | Attending: Emergency Medicine | Admitting: Emergency Medicine

## 2020-11-29 ENCOUNTER — Other Ambulatory Visit: Payer: Self-pay

## 2020-11-29 DIAGNOSIS — J101 Influenza due to other identified influenza virus with other respiratory manifestations: Secondary | ICD-10-CM | POA: Insufficient documentation

## 2020-11-29 DIAGNOSIS — Z20822 Contact with and (suspected) exposure to covid-19: Secondary | ICD-10-CM | POA: Insufficient documentation

## 2020-11-29 DIAGNOSIS — E119 Type 2 diabetes mellitus without complications: Secondary | ICD-10-CM | POA: Insufficient documentation

## 2020-11-29 DIAGNOSIS — F17210 Nicotine dependence, cigarettes, uncomplicated: Secondary | ICD-10-CM | POA: Insufficient documentation

## 2020-11-29 DIAGNOSIS — Z7984 Long term (current) use of oral hypoglycemic drugs: Secondary | ICD-10-CM | POA: Insufficient documentation

## 2020-11-29 LAB — RESP PANEL BY RT-PCR (FLU A&B, COVID) ARPGX2
Influenza A by PCR: POSITIVE — AB
Influenza B by PCR: NEGATIVE
SARS Coronavirus 2 by RT PCR: NEGATIVE

## 2020-11-29 NOTE — ED Triage Notes (Signed)
Pt reports bilateral ear pain, fatigue and congestion for the past few days. Denies fevers. NAD noted. Ambulatory

## 2020-11-29 NOTE — ED Provider Notes (Signed)
[facility] Emergency Department Provider Note    ____________________________________________   Event Date/Time   First MD Initiated Contact with Patient 11/29/20 1636     (approximate)  I have reviewed the triage vital signs and the nursing notes.   HISTORY  Chief Complaint covid sx   HPI Barry Jennings is a 35 y.o. male, history of diabetes, presents to the emergency department for evaluation of flulike symptoms.  Patient states that he has been experiencing fatigue, body aches, sinus congestion, and cough for the past 48 hours.  Denies chest pain, SOB, abdominal pain, or back pain.  Patient has not attempted any treatments at home or been seen by any other provider.  History limited by: No limitations.  Past Medical History:  Diagnosis Date   Diabetes mellitus without complication (HCC)     There are no problems to display for this patient.   No past surgical history on file.  Prior to Admission medications   Medication Sig Start Date End Date Taking? Authorizing Provider  glipiZIDE (GLUCOTROL) 5 MG tablet Take 1 tablet (5 mg total) by mouth 2 (two) times daily before a meal. 10/15/20 04/13/21  Willy Eddy, MD    Allergies Patient has no known allergies.  No family history on file.  Social History Social History   Tobacco Use   Smoking status: Every Day    Packs/day: 1.00    Types: Cigarettes   Smokeless tobacco: Never  Substance Use Topics   Alcohol use: Not Currently   Drug use: No    Review of Systems  Constitutional: Positive for fatigue.  Negative for fever/chills, weight loss, Eyes: Negative for visual changes or discharge.  ENT: Positive for sinus congestion.  Negative for hearing changes, or sore throat.  Gastrointestinal: Negative for abdominal pain, nausea/vomiting, or diarrhea.  Genitourinary: Negative for dysuria or hematuria.  Musculoskeletal: Negative for back pain or joint pain.  Skin: Negative for rashes or  lesions.  Neurological: Negative for headache, syncope, dizziness, tremors, or numbness/tingling.   10-point ROS otherwise negative. ____________________________________________   PHYSICAL EXAM:  VITAL SIGNS: ED Triage Vitals  Enc Vitals Group     BP 11/29/20 1401 (!) 150/97     Pulse Rate 11/29/20 1401 100     Resp 11/29/20 1401 18     Temp 11/29/20 1401 (!) 100.5 F (38.1 C)     Temp Source 11/29/20 1401 Oral     SpO2 11/29/20 1401 94 %     Weight 11/29/20 1358 180 lb (81.6 kg)     Height 11/29/20 1358 5\' 6"  (1.676 m)     Head Circumference --      Peak Flow --      Pain Score 11/29/20 1358 10     Pain Loc --      Pain Edu? --      Excl. in GC? --     Physical Exam Constitutional:      Appearance: Normal appearance.  HENT:     Head: Normocephalic and atraumatic.     Nose: Congestion present.     Mouth/Throat:     Mouth: Mucous membranes are moist.     Pharynx: Oropharynx is clear. No oropharyngeal exudate or posterior oropharyngeal erythema.  Eyes:     Conjunctiva/sclera: Conjunctivae normal.     Pupils: Pupils are equal, round, and reactive to light.  Cardiovascular:     Rate and Rhythm: Normal rate.     Pulses: Normal pulses.     Heart  sounds: Normal heart sounds.  Pulmonary:     Effort: Pulmonary effort is normal.     Breath sounds: Normal breath sounds. No wheezing or rhonchi.  Abdominal:     General: Abdomen is flat.     Palpations: Abdomen is soft.  Musculoskeletal:        General: Normal range of motion.     Cervical back: Normal range of motion and neck supple.  Skin:    General: Skin is warm and dry.  Neurological:     Mental Status: He is alert and oriented to person, place, and time.  Psychiatric:        Mood and Affect: Mood normal.        Behavior: Behavior normal.        Thought Content: Thought content normal.        Judgment: Judgment normal.     ____________________________________________    LABS  (all labs ordered are listed,  but only abnormal results are displayed)  Labs Reviewed  RESP PANEL BY RT-PCR (FLU A&B, COVID) ARPGX2 - Abnormal; Notable for the following components:      Result Value   Influenza A by PCR POSITIVE (*)    All other components within normal limits     ____________________________________________   EKG None.   ____________________________________________    RADIOLOGY I personally viewed and evaluated these images as part of my medical decision making, as well as reviewing the written report by the radiologist.  ED Provider Interpretation: Not applicable.  No results found.  ____________________________________________   PROCEDURES  Procedures   Medications - No data to display  Critical Care performed: No  ____________________________________________   INITIAL IMPRESSION / ASSESSMENT AND PLAN / ED COURSE  Pertinent labs & imaging results that were available during my care of the patient were reviewed by me and considered in my medical decision making (see chart for details).     Barry Jennings is a 35 y.o. male, history of diabetes, presents to the emergency department for evaluation of flulike symptoms.  Patient states that he has been experiencing fatigue, body aches, sinus congestion, and cough for the past 48 hours.  On exam, patient appears fatigued but nontoxic.  Lung sounds are clear bilaterally.  Overall unremarkable physical exam.  Respiratory panel was positive for influenza A.  I suspect that this is likely etiology of the patient's symptoms.  Patient is febrile, but otherwise has stable vital signs. No indication for further treatment or work-up in the emergency department at this time discussed the risks and benefits of oseltamavir with the patient, but he ultimately declined.  We will plan to discharge with  strict return precautions and anticipatory guidance.     ____________________________________________   FINAL CLINICAL IMPRESSION(S) / ED  DIAGNOSES  Final diagnoses:  Influenza A     NEW MEDICATIONS STARTED DURING THIS VISIT:  ED Discharge Orders     None        Note:  This document was prepared using Dragon voice recognition software and may include unintentional dictation errors.    Teodoro Spray, [place] 11/29/20 1652    Vladimir Crofts, MD 11/29/20 [phone]

## 2020-12-02 ENCOUNTER — Encounter: Payer: Self-pay | Admitting: Emergency Medicine

## 2020-12-02 ENCOUNTER — Emergency Department
Admission: EM | Admit: 2020-12-02 | Discharge: 2020-12-02 | Disposition: A | Discharge status: Home / Self Care | Payer: Self-pay | Attending: Emergency Medicine | Admitting: Emergency Medicine

## 2020-12-02 DIAGNOSIS — E119 Type 2 diabetes mellitus without complications: Secondary | ICD-10-CM | POA: Insufficient documentation

## 2020-12-02 DIAGNOSIS — F17210 Nicotine dependence, cigarettes, uncomplicated: Secondary | ICD-10-CM | POA: Insufficient documentation

## 2020-12-02 DIAGNOSIS — H66013 Acute suppurative otitis media with spontaneous rupture of ear drum, bilateral: Secondary | ICD-10-CM | POA: Insufficient documentation

## 2020-12-02 DIAGNOSIS — Z7984 Long term (current) use of oral hypoglycemic drugs: Secondary | ICD-10-CM | POA: Insufficient documentation

## 2020-12-02 MED ORDER — CIPROFLOXACIN-DEXAMETHASONE 0.3-0.1 % OT SUSP
4.0000 [drp] | Freq: Two times a day (BID) | OTIC | Status: DC
  Administered 2020-12-02: 4 [drp] via OTIC
  Filled 2020-12-02: qty 7.5

## 2020-12-02 MED ORDER — AMOXICILLIN-POT CLAVULANATE 875-125 MG PO TABS
1.0000 | ORAL_TABLET | Freq: Once | ORAL | Status: AC
  Administered 2020-12-02: 1 via ORAL
  Filled 2020-12-02: qty 1

## 2020-12-02 MED ORDER — AMOXICILLIN-POT CLAVULANATE 875-125 MG PO TABS: 1.0000 | ORAL_TABLET | Freq: Two times a day (BID) | ORAL | 0 refills | Status: AC

## 2020-12-02 NOTE — Discharge Instructions (Signed)
Please use antibiotic eyedrops please use antibiotic eardrops twice daily.  Try to keep both the ears dry and avoid getting water in them until you follow-up with ENT physician or primary care physician to confirm complete healing of your tympanic membrane.  Take oral antibiotics as prescribed.  Return to the ER for any fevers increasing pain worsening symptoms or urgent changes in your health.

## 2020-12-02 NOTE — ED Notes (Signed)
Dc ppw provided. Pt denies any questions. Followup and rx information provided. Pt provides verbal consent for dc. Pt assisted off unit

## 2020-12-02 NOTE — ED Triage Notes (Signed)
Pt in with bilateral ear pain ongoing x 2 days. States he came to ED for eval a few days ago for similar, but pain has worsened since. Flu+, still dealing with congestion as well, denies any fevers.

## 2020-12-02 NOTE — ED Provider Notes (Signed)
[facility] EMERGENCY DEPARTMENT Provider Note   CSN: [id number] Arrival date & time: 12/02/20  1837     History Chief Complaint  Patient presents with   Otalgia    Barry Jennings is a 35 y.o. male complains of bilateral ear pain x2 days.  Has had a lot of pressure up until today, today started having some draining in both ears.  Denies any fevers, headaches, neck pain, chest pain, shortness of breath.  Coughing symptoms have improved.  HPI     Past Medical History:  Diagnosis Date   Diabetes mellitus without complication (HCC)     There are no problems to display for this patient.   History reviewed. No pertinent surgical history.     No family history on file.  Social History   Tobacco Use   Smoking status: Every Day    Packs/day: 1.00    Types: Cigarettes   Smokeless tobacco: Never  Substance Use Topics   Alcohol use: Not Currently   Drug use: No    Home Medications Prior to Admission medications   Medication Sig Start Date End Date Taking? Authorizing Provider  amoxicillin-clavulanate (AUGMENTIN) 875-125 MG tablet Take 1 tablet by mouth every 12 (twelve) hours for 7 days. 12/02/20 12/09/20 Yes Evon Slack, PA-C  glipiZIDE (GLUCOTROL) 5 MG tablet Take 1 tablet (5 mg total) by mouth 2 (two) times daily before a meal. 10/15/20 04/13/21  Willy Eddy, MD    Allergies    Patient has no known allergies.  Review of Systems   Review of Systems  Constitutional:  Negative for chills and fever.  HENT:  Positive for ear discharge and ear pain. Negative for congestion and facial swelling.   Gastrointestinal:  Negative for nausea and vomiting.  Skin:  Negative for rash and wound.   Physical Exam Updated Vital Signs BP (!) 150/87 (BP Location: Left Arm)   Pulse (!) 114   Temp 99.1 F (37.3 C) (Oral)   Resp 20   Ht 5\' 7"  (1.702 m)   Wt 86.2 kg Comment: Simultaneous filing. User may not have seen previous data.  SpO2 96%   BMI  29.76 kg/m   Physical Exam Constitutional:      Appearance: He is well-developed.  HENT:     Head: Normocephalic and atraumatic.     Ears:     Comments: Bilateral ear canals with slightly purulent bloody drainage.  TMs visualized with very small defect.  Mild erythema bilaterally.  No mastoid tenderness or facial swelling. Eyes:     Conjunctiva/sclera: Conjunctivae normal.  Cardiovascular:     Rate and Rhythm: Normal rate.  Pulmonary:     Effort: Pulmonary effort is normal. No respiratory distress.  Musculoskeletal:        General: Normal range of motion.     Cervical back: Normal range of motion.  Skin:    General: Skin is warm.     Findings: No rash.  Neurological:     Mental Status: He is alert and oriented to person, place, and time.  Psychiatric:        Behavior: Behavior normal.        Thought Content: Thought content normal.    ED Results / Procedures / Treatments   Labs (all labs ordered are listed, but only abnormal results are displayed) Labs Reviewed - No data to display  EKG None  Radiology No results found.  Procedures Procedures   Medications Ordered in ED Medications  ciprofloxacin-dexamethasone (CIPRODEX)  0.3-0.1 % OTIC (EAR) suspension 4 drop (has no administration in time range)  amoxicillin-clavulanate (AUGMENTIN) 875-125 MG per tablet 1 tablet (has no administration in time range)    ED Course  I have reviewed the triage vital signs and the nursing notes.  Pertinent labs & imaging results that were available during my care of the patient were reviewed by me and considered in my medical decision making (see chart for details).    MDM Rules/Calculators/A&P                         35 year old male with bilateral ear pain, drainage.  History and exam consistent with otitis media with perforation.  He is placed on oral and topical antibiotics and will follow-up with ENT or PCP.  He understands signs symptoms return to the ER for.  He will  continue to keep both ears clean and dry until confirmation of healed TMs Final Clinical Impression(s) / ED Diagnoses Final diagnoses:  Non-recurrent acute suppurative otitis media of both ears with spontaneous rupture of tympanic membranes    Rx / DC Orders ED Discharge Orders          Ordered    amoxicillin-clavulanate (AUGMENTIN) 875-125 MG tablet  Every 12 hours        12/02/20 2102             Ronnette Juniper 12/02/20 2108    Shaune Pollack, MD 12/07/20 [phone]
